# Patient Record
Sex: Female | Born: 1960
Health system: Southern US, Community
[De-identification: ages and names within clinical notes are randomized; demographics above are authoritative.]

## PROBLEM LIST (undated history)

## (undated) DIAGNOSIS — K219 Gastro-esophageal reflux disease without esophagitis: Secondary | ICD-10-CM

## (undated) DIAGNOSIS — Z87442 Personal history of urinary calculi: Secondary | ICD-10-CM

## (undated) DIAGNOSIS — N189 Chronic kidney disease, unspecified: Secondary | ICD-10-CM

## (undated) HISTORY — PX: COLONOSCOPY: SHX174

## (undated) HISTORY — PX: DILATION AND CURETTAGE OF UTERUS: SHX78

## (undated) HISTORY — PX: WISDOM TOOTH EXTRACTION: SHX21

## (undated) HISTORY — PX: OTHER SURGICAL HISTORY: SHX169

## (undated) HISTORY — PX: SPINE SURGERY: SHX786

---

## 1991-11-16 HISTORY — PX: TUBAL LIGATION: SHX77

## 2001-11-22 ENCOUNTER — Other Ambulatory Visit: Admission: RE | Admit: 2001-11-22 | Discharge: 2001-11-22 | Payer: Self-pay | Admitting: Obstetrics and Gynecology

## 2003-03-09 ENCOUNTER — Encounter: Payer: Self-pay | Admitting: Emergency Medicine

## 2003-03-09 ENCOUNTER — Emergency Department (HOSPITAL_COMMUNITY): Admission: EM | Admit: 2003-03-09 | Discharge: 2003-03-09 | Payer: Self-pay | Admitting: Emergency Medicine

## 2003-03-21 ENCOUNTER — Other Ambulatory Visit: Admission: RE | Admit: 2003-03-21 | Discharge: 2003-03-21 | Payer: Self-pay | Admitting: Obstetrics and Gynecology

## 2003-04-09 ENCOUNTER — Encounter: Payer: Self-pay | Admitting: Neurosurgery

## 2003-04-09 ENCOUNTER — Encounter: Admission: RE | Admit: 2003-04-09 | Discharge: 2003-04-09 | Payer: Self-pay | Admitting: Neurosurgery

## 2003-04-09 ENCOUNTER — Encounter: Payer: Self-pay | Admitting: Radiology

## 2003-04-22 ENCOUNTER — Encounter: Payer: Self-pay | Admitting: Neurosurgery

## 2003-04-22 ENCOUNTER — Encounter: Admission: RE | Admit: 2003-04-22 | Discharge: 2003-04-22 | Payer: Self-pay | Admitting: Neurosurgery

## 2004-10-12 ENCOUNTER — Other Ambulatory Visit: Admission: RE | Admit: 2004-10-12 | Discharge: 2004-10-12 | Payer: Self-pay | Admitting: Obstetrics and Gynecology

## 2006-10-24 ENCOUNTER — Ambulatory Visit (HOSPITAL_COMMUNITY): Admission: RE | Admit: 2006-10-24 | Discharge: 2006-10-25 | Payer: Self-pay | Admitting: Neurosurgery

## 2009-10-30 ENCOUNTER — Emergency Department (HOSPITAL_COMMUNITY): Admission: EM | Admit: 2009-10-30 | Discharge: 2009-10-30 | Payer: Self-pay | Admitting: Family Medicine

## 2009-11-01 ENCOUNTER — Emergency Department (HOSPITAL_COMMUNITY): Admission: EM | Admit: 2009-11-01 | Discharge: 2009-11-01 | Payer: Self-pay | Admitting: Family Medicine

## 2011-04-02 NOTE — Op Note (Signed)
[name], ENSLOW              ACCOUNT NO.:  [id number]   MEDICAL RECORD NO.:  [id number]          PATIENT TYPE:  AMB   LOCATION:  SDS                          FACILITY:  [facility]   PHYSICIAN:  Coletta Memos, M.D.     DATE OF BIRTH:  [birth year]   DATE OF PROCEDURE:  10/24/2006  DATE OF DISCHARGE:                               OPERATIVE REPORT   PREOPERATIVE DIAGNOSES:  1. Displaced disk left L5-S1.  2. Left S1 radiculopathy.   POSTOPERATIVE DIAGNOSES:  1. Displaced disk left L5-S1.  2. Left S1 radiculopathy.   PROCEDURE:  Left S1 semihemilaminectomy and diskectomy with  microdissection.   COMPLICATIONS:  None.   SURGEON:  Coletta Memos, M.D.   ASSISTANT:  Hilda Lias, M.D.   INDICATIONS:  [name] [name] is a 85 year old with severe pain in the  back and left lower extremity.  I had previously treated her for  herniated disk at L4-5 with epidural steroids.  She returns today for a  disk at L5-S1 which was quite large and had a large free fragment  associated with that.  I therefore recommended and she agreed to undergo  operative decompression.   OPERATIVE NOTE:  [name] was brought to the operating room  intubated and placed under general anesthetic without difficulty.  She  was rolled prone onto a Wilson frame and all pressure points were  properly padded.  Back was prepped and she was draped in sterile  fashion.  I infiltrated 7 mL 0.5% lidocaine with 1:200,000 epinephrine  into the lumbar region.  I opened the skin with a #10 blade and took  this down to the thoracolumbar fascia sharply.  I then created  semicircular flap in the fascia and exposed the lamina of L5-S1.  I took  an intraoperative x-ray and showed I was in the correct interlaminar  space.  I then opened the ligamentum flavum using a #15 blade to cut  through.  I used a Kerrison punch to then removed the ligamentum flavum  and expose the thecal sac and epidural fat.  At that time I was able  retract the thecal sac medially and encountered what was a very large  hump of disk.  I felt that with further caudal exposure I would be able  to the decrease the amount of traction I had to placed on the thecal sac  and S1 nerve roots.  I removed more bone using a Kerrison punch, this  being the semihemilaminectomy of S1.   I then brought the microscope into the operative field and with  microdissection, I cleared some epidural veins cauterized and divided  those sharply to free the fragment.  I then opened the fragment with a  #15 blade and pulled out two very large pieces of disk material.  I then  inspected the nerve root and surrounding tissue.  I did not go into  the disk space but I did coagulate vessels above it.  The nerve root was  obviously decompressed once I had taken out the fragment.  I then  irrigated, closed wound in layered fashion with Vicryl sutures  reapproximating the thoracolumbar fascia, subcutaneous tissue and  subcuticular layer.  Dr. Jeral Fruit assisted with the diskectomy.           ______________________________  Coletta Memos, M.D.     KC/MEDQ  D:  10/24/2006  T:  10/25/2006  Job:  [id number]

## 2011-05-13 ENCOUNTER — Ambulatory Visit (INDEPENDENT_AMBULATORY_CARE_PROVIDER_SITE_OTHER): Payer: 59 | Admitting: Surgery

## 2011-05-13 ENCOUNTER — Encounter (INDEPENDENT_AMBULATORY_CARE_PROVIDER_SITE_OTHER): Payer: Self-pay | Admitting: Surgery

## 2011-05-13 VITALS — BP 128/86 | HR 70 | Temp 97.2°F | Ht 64.0 in | Wt 153.0 lb

## 2011-05-13 DIAGNOSIS — L720 Epidermal cyst: Secondary | ICD-10-CM | POA: Insufficient documentation

## 2011-05-13 DIAGNOSIS — L02414 Cutaneous abscess of left upper limb: Secondary | ICD-10-CM

## 2011-05-13 DIAGNOSIS — L723 Sebaceous cyst: Secondary | ICD-10-CM

## 2011-05-13 DIAGNOSIS — IMO0002 Reserved for concepts with insufficient information to code with codable children: Secondary | ICD-10-CM

## 2011-05-13 NOTE — Patient Instructions (Signed)
Heat / Massage lumps.  They should resolve in 2 months.  Call if worse or persisting.  OK to stop antibiotics

## 2011-05-13 NOTE — Progress Notes (Signed)
Subjective:     Patient ID: [name] [name], female   DOB: [birth year], 50 y.o.   MRN: [id number]    BP 128/86  Pulse 70  Temp 97.2 F (36.2 C)  Ht 5\' 4"  (1.626 m)  Wt 153 lb (69.4 kg)  BMI 26.26 kg/m2    HPI  Reason for visit is abscess left normal arm consideration of surgery.  Patient is a 50 year old female with a history of a cyst on her body. She had one on her right wrist that spontaneous drained & healed. She had one up near her tailbone that drained and healed. She had one on her right thigh that was removed in surgery.  Patient notes last week she had a lump between her forearm and the arm. She squeezed out some fluid out. He became enlarged and inflamed. She followed up with her primary doctor Dr. Cyndia Bent. She was started on Augmentin antibiotic.  She has improved. She denies any fevers or chills. It now does not drain. She is not sore anymore.  Review of Systems  Constitutional: Negative for fever, chills, diaphoresis and unexpected weight change.  Respiratory: Negative for chest tightness and shortness of breath.   Musculoskeletal: Negative for back pain and arthralgias.  Skin: Negative for color change.       Lump on left forearm - improving  Neurological: Negative for dizziness, numbness and headaches.  Hematological: Negative for adenopathy. Does not bruise/bleed easily.  All other systems reviewed and are negative.       Objective:   Physical Exam  Constitutional: She is oriented to person, place, and time. She appears well-developed and well-nourished. No distress.  HENT:  Head: Normocephalic.  Mouth/Throat: Oropharynx is clear and moist.  Eyes: Conjunctivae and EOM are normal. Pupils are equal, round, and reactive to light. Left eye exhibits no discharge.  Neck: Normal range of motion. Neck supple. No tracheal deviation present. No thyromegaly present.  Cardiovascular: Normal rate, regular rhythm and normal heart sounds.   Pulmonary/Chest: Effort normal  and breath sounds normal. No respiratory distress. She has no wheezes. She has no rales.  Abdominal: Soft. Bowel sounds are normal. She exhibits no distension. There is no tenderness. There is no rebound and no guarding.  Musculoskeletal: Normal range of motion. She exhibits edema. She exhibits no tenderness.       Left forearm / arm mass across fossa 1x1x6cm long.  Nontender, not hot, good range of motion at the elbow  Lymphadenopathy:    She has no cervical adenopathy.  Neurological: She is alert and oriented to person, place, and time. No cranial nerve deficit.  Skin: Skin is warm and dry. No rash noted. She is not diaphoretic. No erythema. No pallor.  Psychiatric: She has a normal mood and affect.       Assessment:     Mass going from left lateral forearm to arm, healing. Most likely old cyst versus thrombophlebitis    Plan:     At this point it seems to be improving on its own. She's completed her Augmentin antibiotics. She had diarrhea with an does not wish to start.  I would like to see how what happens to this naturally. He if it infection or inflammation such as thrombophlebitis, that should actually soft and heal up over the next few months. If it gets reinfected or does not go away, then she may need surgery to remove it. It would best to do this when it's not infected so that we could be  closed.  She feels comfortable with this plan.

## 2013-01-18 ENCOUNTER — Encounter (HOSPITAL_COMMUNITY): Payer: Self-pay | Admitting: *Deleted

## 2013-01-18 ENCOUNTER — Other Ambulatory Visit: Payer: Self-pay | Admitting: Urology

## 2013-01-18 NOTE — Pre-Procedure Instructions (Signed)
Asked to bring blue folder the day of the procedure,insurance card,I.D. driver's license,wear comfortable clothing and have a driver for the day. Asked not to take Advil,Motrin,Ibuprofen,Aleve or any NSAIDS, Aspirin, or Toradol for 72 hours prior to procedure,  No vitamins or herbal medications 7 days prior to procedure. Instructed to take laxative per doctor's office instructions and eat a light dinner the evening before procedure.   To arrive at 0645 for lithotripsy procedure.  

## 2013-01-19 ENCOUNTER — Encounter (HOSPITAL_COMMUNITY): Payer: Self-pay | Admitting: Pharmacy Technician

## 2013-01-24 NOTE — H&P (Signed)
  H&P  Chief Complaint: Left sided kidney stone  History of Present Illness: [name] [name] is a 52 y.o. year old female who recently presented with left hydronephrosis seen on an MRI performed for back pain. CT stone protocol was erformed which revealed an 8x10 mm proximal left ureteral stone. SSD was 12 cm, stone was 840 HU in density. She has significant pain, and presents now for ESL.  Past Medical History  Diagnosis Date  . Chronic kidney disease     kidney stone    Past Surgical History  Procedure Laterality Date  . Tubal ligation  1993  . Dilation and curettage of uterus    . Spine surgery      disc fragment removed    Home Medications:  No prescriptions prior to admission    Allergies:  Allergies  Allergen Reactions  . Sulfa Antibiotics Rash    Family History  Problem Relation Age of Onset  . Hypertension Mother     Social History:  reports that she has been smoking.  She has never used smokeless tobacco. She reports that  drinks alcohol. She reports that she does not use illicit drugs.  ROS: Genitourinary, constitutional, skin, eye, otolaryngeal, hematologic/lymphatic, cardiovascular, pulmonary, endocrine, musculoskeletal, gastrointestinal, neurological and psychiatric system(s) were reviewed and pertinent findings if present are noted.  Genitourinary: nocturia and urinary stream starts and stops.  Gastrointestinal: heartburn.  Musculoskeletal: back pain.      Physical Exam:  Vital signs in last 24 hours:   Constitutional: Well nourished and well developed . No acute distress.  ENT:. The ears and nose are normal in appearance.  Neck: The appearance of the neck is normal and no neck mass is present.  Pulmonary: No respiratory distress and normal respiratory rhythm and effort.  Cardiovascular: Heart rate and rhythm are normal . No peripheral edema.  Abdomen: The abdomen is rounded. The abdomen is soft and nontender. No masses are palpated. No CVA  tenderness. No hernias are palpable. No hepatosplenomegaly noted.  Lymphatics: The femoral and inguinal nodes are not enlarged or tender.  Skin: Normal skin turgor, no visible rash and no visible skin lesions.  Neuro/Psych:. Mood and affect are appropriate.   Laboratory Data:  No results found for this or any previous visit (from the past 24 hour(s)). No results found for this or any previous visit (from the past 240 hour(s)). Creatinine: No results found for this basename: CREATININE,  in the last 168 hours  Radiologic Imaging: No results found.  Impression/Assessment:  8x10 mm left proximal ureteral ston  Plan:  Left ESL  [name] [name] 01/24/2013, 8:41 PM  [name] Millard. Dahlstedt MD

## 2013-01-25 ENCOUNTER — Ambulatory Visit (HOSPITAL_COMMUNITY)
Admission: RE | Admit: 2013-01-25 | Discharge: 2013-01-25 | Disposition: A | Discharge status: Home / Self Care | Payer: 59 | Source: Ambulatory Visit | Attending: Urology | Admitting: Urology

## 2013-01-25 ENCOUNTER — Encounter (HOSPITAL_COMMUNITY): Payer: Self-pay | Admitting: *Deleted

## 2013-01-25 ENCOUNTER — Encounter (HOSPITAL_COMMUNITY)
Admission: RE | Disposition: A | Discharge status: Home / Self Care | Payer: Self-pay | Source: Ambulatory Visit | Attending: Urology

## 2013-01-25 ENCOUNTER — Ambulatory Visit (HOSPITAL_COMMUNITY): Payer: 59

## 2013-01-25 DIAGNOSIS — N201 Calculus of ureter: Secondary | ICD-10-CM | POA: Insufficient documentation

## 2013-01-25 DIAGNOSIS — N1330 Unspecified hydronephrosis: Secondary | ICD-10-CM | POA: Insufficient documentation

## 2013-01-25 HISTORY — DX: Chronic kidney disease, unspecified: N18.9

## 2013-01-25 SURGERY — LITHOTRIPSY, ESWL
Anesthesia: LOCAL | Laterality: Left

## 2013-01-25 MED ORDER — DIAZEPAM 5 MG PO TABS
10.0000 mg | ORAL_TABLET | ORAL | Status: AC
  Administered 2013-01-25: 10 mg via ORAL
  Filled 2013-01-25: qty 2

## 2013-01-25 MED ORDER — DIPHENHYDRAMINE HCL 25 MG PO CAPS
25.0000 mg | ORAL_CAPSULE | ORAL | Status: AC
  Administered 2013-01-25: 25 mg via ORAL
  Filled 2013-01-25: qty 1

## 2013-01-25 MED ORDER — DEXTROSE-NACL 5-0.45 % IV SOLN
INTRAVENOUS | Status: DC
  Administered 2013-01-25: 100 mL via INTRAVENOUS

## 2013-01-25 MED ORDER — LEVOFLOXACIN 500 MG PO TABS
500.0000 mg | ORAL_TABLET | ORAL | Status: AC
  Administered 2013-01-25: 500 mg via ORAL
  Filled 2013-01-25 (×2): qty 1

## 2013-11-02 DIAGNOSIS — N200 Calculus of kidney: Secondary | ICD-10-CM | POA: Insufficient documentation

## 2014-08-05 LAB — HM COLONOSCOPY

## 2014-09-09 NOTE — H&P (Signed)
Cindee SaltLauren B Every  DICTATION # [id number] CSN# [id number]   Meriel PicaHOLLAND,Samia Kukla M, MD 09/09/2014 2:01 PM

## 2014-09-10 NOTE — H&P (Signed)
NAMChristia [name]:  [name], [name]              ACCOUNT NO.:  [id number]  MEDICAL RECORD NO.:  [id number]  LOCATION:                                 FACILITY:  PHYSICIAN:  Duke Salviaichard M. Marcelle OverlieHolland, M.D.    DATE OF BIRTH:  DATE OF ADMISSION:  09/19/2014 DATE OF DISCHARGE:                             HISTORY & PHYSICAL   HISTORY OF PRESENT ILLNESS:  A 53 year old, G2, P2, postmenopausal patient who has had a prior tubal, who has had pelvic prolapse symptoms for several years, but the pressure and discomfort have worsened recently.  I had not seen her in several years until July 2015, when she came back presenting with similar complaints.  Because of some mild SUI symptoms, she had urodynamics that showed normal bladder function with very little leakage noted with a full bladder on straining.  By exam, she does have a moderate uterine prolapse along with cystocele and rectocele.  After discussion of her options, she prefers to have LAVH, A and P repair with bilateral salpingectomy and sacrospinous ligament fixation to support the cuff.  We decided not to pursue midurethral sling at this time because of minimal symptoms.  This procedure including specific risks related to bleeding, infection, transfusion, adjacent organ injury reviewed.  The need for some vaginal estrogen support postop to maintain tissue strength was discussed also.  Other specific risks, the possible need for complete the surgery abdominally along with her expected recovery time reviewed, which she understands and accepts.  PAST MEDICAL HISTORY:  ALLERGIES:  SULFA.  CURRENT MEDICATIONS:  None.  PRIOR SURGERY:  Lithotripsy, minor back surgery, tubal ligation, and two vaginal deliveries.  REVIEW OF SYSTEMS:  Unremarkable.  FAMILY HISTORY:  Significant for hypertension, otherwise negative.  SOCIAL HISTORY:  She is married.  Denies alcohol, tobacco, or drug use.  PHYSICAL EXAMINATION:  VITAL SIGNS:  Temperature 98.2, blood  pressure 120/78. HEENT:  Unremarkable. NECK:  Supple without masses. LUNGS:  Clear. CARDIOVASCULAR:  Regular rate and rhythm without murmurs, rubs, or gallops heard. BREASTS:  Without masses. ABDOMEN:  Soft, flat, nontender.  Vulva, vagina, cervix normal. Moderate uterine descensus with straining along with moderate cystocele and rectocele noted bimanual, otherwise negative.  IMPRESSION:  Symptomatic uterine prolapse with cystocele and rectocele.  PLAN:  LAVH, bilateral salpingectomy with A and P repair, sacrospinous ligament suspension.  Procedure and risks discussed as above.     [name] [name] M. Marcelle OverlieHolland, M.D.     [facility]/MEDQ  D:  09/09/2014  T:  09/10/2014  Job:  [id number]

## 2014-09-13 NOTE — Patient Instructions (Addendum)
   Your procedure is scheduled on:  Thursday, Nov 5  [facility]:  6 AM Pick up the phone at the desk and dial [phone] and inform us of your arrival.  Please call this number if you have any problems the morning of surgery: [phone]  Remember: Do not eat or drink after midnight: Wednesday Take these medicines the morning of surgery with a SIP OF WATER: Nexium  Do not wear jewelry, make-up, or FINGER nail polish No metal in your hair or on your body. Do not wear lotions, powders, perfumes.  You may wear deodorant.  Do not bring valuables to the hospital. Contacts, dentures or bridgework may not be worn into surgery.  Leave suitcase in the car. After Surgery it may be brought to your room. For patients being admitted to the hospital, checkout time is 11:00am the day of discharge.  Home with husband Dorinda HillDonald cell [phone].

## 2014-09-16 ENCOUNTER — Encounter (HOSPITAL_COMMUNITY)
Admission: RE | Admit: 2014-09-16 | Discharge: 2014-09-16 | Disposition: A | Discharge status: Home / Self Care | Payer: 59 | Source: Ambulatory Visit | Attending: Obstetrics and Gynecology | Admitting: Obstetrics and Gynecology

## 2014-09-16 ENCOUNTER — Encounter (HOSPITAL_COMMUNITY): Payer: Self-pay

## 2014-09-16 DIAGNOSIS — Z87891 Personal history of nicotine dependence: Secondary | ICD-10-CM | POA: Diagnosis not present

## 2014-09-16 DIAGNOSIS — N814 Uterovaginal prolapse, unspecified: Secondary | ICD-10-CM | POA: Diagnosis present

## 2014-09-16 HISTORY — DX: Personal history of urinary calculi: Z87.442

## 2014-09-16 HISTORY — DX: Gastro-esophageal reflux disease without esophagitis: K21.9

## 2014-09-16 LAB — CBC
HCT: 42.9 % (ref 36.0–46.0)
HEMOGLOBIN: 14.2 g/dL (ref 12.0–15.0)
MCH: 31.9 pg (ref 26.0–34.0)
MCHC: 33.1 g/dL (ref 30.0–36.0)
MCV: 96.4 fL (ref 78.0–100.0)
PLATELETS: 257 10*3/uL (ref 150–400)
RBC: 4.45 MIL/uL (ref 3.87–5.11)
RDW: 12 % (ref 11.5–15.5)
WBC: 7.8 10*3/uL (ref 4.0–10.5)

## 2014-09-18 MED ORDER — DEXTROSE 5 % IV SOLN
2.0000 g | INTRAVENOUS | Status: AC
  Administered 2014-09-19: 2 g via INTRAVENOUS
  Filled 2014-09-18: qty 2

## 2014-09-19 ENCOUNTER — Ambulatory Visit (HOSPITAL_COMMUNITY): Payer: 59 | Admitting: Certified Registered Nurse Anesthetist

## 2014-09-19 ENCOUNTER — Encounter (HOSPITAL_COMMUNITY): Payer: Self-pay | Admitting: Anesthesiology

## 2014-09-19 ENCOUNTER — Encounter (HOSPITAL_COMMUNITY)
Admission: RE | Disposition: A | Discharge status: Home / Self Care | Payer: Self-pay | Source: Ambulatory Visit | Attending: Obstetrics and Gynecology

## 2014-09-19 ENCOUNTER — Observation Stay (HOSPITAL_COMMUNITY)
Admission: RE | Admit: 2014-09-19 | Discharge: 2014-09-20 | Disposition: A | Discharge status: Home / Self Care | Payer: 59 | Source: Ambulatory Visit | Attending: Obstetrics and Gynecology | Admitting: Obstetrics and Gynecology

## 2014-09-19 DIAGNOSIS — N814 Uterovaginal prolapse, unspecified: Secondary | ICD-10-CM | POA: Diagnosis not present

## 2014-09-19 DIAGNOSIS — Z87891 Personal history of nicotine dependence: Secondary | ICD-10-CM | POA: Insufficient documentation

## 2014-09-19 HISTORY — PX: LAPAROSCOPY: SHX197

## 2014-09-19 HISTORY — PX: ANTERIOR AND POSTERIOR REPAIR WITH SACROSPINOUS FIXATION: SHX6536

## 2014-09-19 HISTORY — PX: VAGINAL HYSTERECTOMY: SHX2639

## 2014-09-19 SURGERY — LAPAROSCOPY, DIAGNOSTIC
Anesthesia: General | Site: Vagina

## 2014-09-19 MED ORDER — ONDANSETRON HCL 4 MG/2ML IJ SOLN: 4.0000 mg | Freq: Four times a day (QID) | INTRAMUSCULAR | Status: DC | PRN

## 2014-09-19 MED ORDER — IBUPROFEN 800 MG PO TABS: 800.0000 mg | ORAL_TABLET | Freq: Three times a day (TID) | ORAL | Status: DC | PRN

## 2014-09-19 MED ORDER — DEXAMETHASONE SODIUM PHOSPHATE 10 MG/ML IJ SOLN
INTRAMUSCULAR | Status: DC | PRN
  Administered 2014-09-19: 4 mg via INTRAVENOUS

## 2014-09-19 MED ORDER — MEPERIDINE HCL 25 MG/ML IJ SOLN
6.2500 mg | INTRAMUSCULAR | Status: DC | PRN
  Administered 2014-09-19: 6.25 mg via INTRAVENOUS

## 2014-09-19 MED ORDER — ESTROGENS, CONJUGATED 0.625 MG/GM VA CREA
TOPICAL_CREAM | VAGINAL | Status: AC
  Filled 2014-09-19: qty 42.5

## 2014-09-19 MED ORDER — ATROPINE SULFATE 0.4 MG/ML IJ SOLN
INTRAMUSCULAR | Status: DC | PRN
  Administered 2014-09-19: 0.4 mg via INTRAVENOUS

## 2014-09-19 MED ORDER — NEOSTIGMINE METHYLSULFATE 10 MG/10ML IV SOLN
INTRAVENOUS | Status: DC | PRN
Start: 2014-09-19 — End: 2014-09-19
  Administered 2014-09-19: 2 mg via INTRAVENOUS

## 2014-09-19 MED ORDER — SODIUM CHLORIDE 0.9 % IJ SOLN
INTRAMUSCULAR | Status: AC
  Filled 2014-09-19: qty 10

## 2014-09-19 MED ORDER — KETOROLAC TROMETHAMINE 30 MG/ML IJ SOLN: 30.0000 mg | Freq: Four times a day (QID) | INTRAMUSCULAR | Status: DC

## 2014-09-19 MED ORDER — SODIUM CHLORIDE 0.9 % IJ SOLN: 9.0000 mL | INTRAMUSCULAR | Status: DC | PRN

## 2014-09-19 MED ORDER — LIDOCAINE HCL (CARDIAC) 20 MG/ML IV SOLN
INTRAVENOUS | Status: DC | PRN
  Administered 2014-09-19: 30 mg via INTRAVENOUS

## 2014-09-19 MED ORDER — ONDANSETRON HCL 4 MG/2ML IJ SOLN
INTRAMUSCULAR | Status: AC
  Filled 2014-09-19: qty 2

## 2014-09-19 MED ORDER — MORPHINE SULFATE (PF) 1 MG/ML IV SOLN
INTRAVENOUS | Status: DC
  Administered 2014-09-19: 6 mg via INTRAVENOUS
  Administered 2014-09-19: 12:00:00 via INTRAVENOUS
  Administered 2014-09-19: 9 mL via INTRAVENOUS
  Filled 2014-09-19: qty 25

## 2014-09-19 MED ORDER — SODIUM CHLORIDE 0.9 % IJ SOLN
INTRAMUSCULAR | Status: DC | PRN
  Administered 2014-09-19: 10 mL

## 2014-09-19 MED ORDER — DEXTROSE IN LACTATED RINGERS 5 % IV SOLN
INTRAVENOUS | Status: DC
  Administered 2014-09-19 – 2014-09-20 (×3): via INTRAVENOUS

## 2014-09-19 MED ORDER — FENTANYL CITRATE 0.05 MG/ML IJ SOLN
INTRAMUSCULAR | Status: AC
  Filled 2014-09-19: qty 5

## 2014-09-19 MED ORDER — HYDROMORPHONE HCL 1 MG/ML IJ SOLN
INTRAMUSCULAR | Status: AC
  Administered 2014-09-19: 0.5 mg via INTRAVENOUS
  Filled 2014-09-19: qty 1

## 2014-09-19 MED ORDER — MENTHOL 3 MG MT LOZG: 1.0000 | LOZENGE | OROMUCOSAL | Status: DC | PRN

## 2014-09-19 MED ORDER — NEOSTIGMINE METHYLSULFATE 10 MG/10ML IV SOLN
INTRAVENOUS | Status: AC
  Filled 2014-09-19: qty 1

## 2014-09-19 MED ORDER — ESTROGENS, CONJUGATED 0.625 MG/GM VA CREA
TOPICAL_CREAM | VAGINAL | Status: DC | PRN
  Administered 2014-09-19: 1 via VAGINAL

## 2014-09-19 MED ORDER — KETOROLAC TROMETHAMINE 30 MG/ML IJ SOLN
INTRAMUSCULAR | Status: DC | PRN
  Administered 2014-09-19: 30 mg via INTRAVENOUS

## 2014-09-19 MED ORDER — PROPOFOL 10 MG/ML IV EMUL
INTRAVENOUS | Status: AC
  Filled 2014-09-19: qty 20

## 2014-09-19 MED ORDER — LIDOCAINE-EPINEPHRINE 1 %-1:100000 IJ SOLN
INTRAMUSCULAR | Status: DC | PRN
  Administered 2014-09-19: 4 mL

## 2014-09-19 MED ORDER — OXYCODONE-ACETAMINOPHEN 5-325 MG PO TABS
1.0000 | ORAL_TABLET | ORAL | Status: DC | PRN
  Administered 2014-09-20: 1 via ORAL
  Filled 2014-09-19: qty 1

## 2014-09-19 MED ORDER — KETOROLAC TROMETHAMINE 30 MG/ML IJ SOLN
30.0000 mg | Freq: Four times a day (QID) | INTRAMUSCULAR | Status: DC
  Administered 2014-09-19 – 2014-09-20 (×3): 30 mg via INTRAVENOUS
  Filled 2014-09-19 (×3): qty 1

## 2014-09-19 MED ORDER — FENTANYL CITRATE 0.05 MG/ML IJ SOLN
INTRAMUSCULAR | Status: DC | PRN
  Administered 2014-09-19 (×5): 50 ug via INTRAVENOUS

## 2014-09-19 MED ORDER — KETOROLAC TROMETHAMINE 30 MG/ML IJ SOLN: 15.0000 mg | Freq: Once | INTRAMUSCULAR | Status: DC | PRN

## 2014-09-19 MED ORDER — HYDROMORPHONE HCL 1 MG/ML IJ SOLN
0.2500 mg | INTRAMUSCULAR | Status: DC | PRN
  Administered 2014-09-19 (×2): 0.5 mg via INTRAVENOUS

## 2014-09-19 MED ORDER — BUPIVACAINE HCL (PF) 0.25 % IJ SOLN
INTRAMUSCULAR | Status: AC
  Filled 2014-09-19: qty 30

## 2014-09-19 MED ORDER — MEPERIDINE HCL 25 MG/ML IJ SOLN
INTRAMUSCULAR | Status: AC
  Filled 2014-09-19: qty 1

## 2014-09-19 MED ORDER — DIPHENHYDRAMINE HCL 50 MG/ML IJ SOLN: 12.5000 mg | Freq: Four times a day (QID) | INTRAMUSCULAR | Status: DC | PRN

## 2014-09-19 MED ORDER — PROPOFOL 10 MG/ML IV BOLUS
INTRAVENOUS | Status: DC | PRN
  Administered 2014-09-19: 180 mg via INTRAVENOUS

## 2014-09-19 MED ORDER — LIDOCAINE HCL (PF) 1 % IJ SOLN
INTRAMUSCULAR | Status: AC
  Filled 2014-09-19: qty 5

## 2014-09-19 MED ORDER — SCOPOLAMINE 1 MG/3DAYS TD PT72
MEDICATED_PATCH | TRANSDERMAL | Status: AC
  Administered 2014-09-19: 1.5 mg via TRANSDERMAL
  Filled 2014-09-19: qty 1

## 2014-09-19 MED ORDER — BUTORPHANOL TARTRATE 1 MG/ML IJ SOLN: 1.0000 mg | INTRAMUSCULAR | Status: DC | PRN

## 2014-09-19 MED ORDER — NALOXONE HCL 0.4 MG/ML IJ SOLN: 0.4000 mg | INTRAMUSCULAR | Status: DC | PRN

## 2014-09-19 MED ORDER — ONDANSETRON HCL 4 MG/2ML IJ SOLN
INTRAMUSCULAR | Status: DC | PRN
  Administered 2014-09-19: 4 mg via INTRAVENOUS

## 2014-09-19 MED ORDER — DIPHENHYDRAMINE HCL 12.5 MG/5ML PO ELIX: 12.5000 mg | ORAL_SOLUTION | Freq: Four times a day (QID) | ORAL | Status: DC | PRN

## 2014-09-19 MED ORDER — ONDANSETRON HCL 4 MG PO TABS: 4.0000 mg | ORAL_TABLET | Freq: Four times a day (QID) | ORAL | Status: DC | PRN

## 2014-09-19 MED ORDER — MIDAZOLAM HCL 2 MG/2ML IJ SOLN
INTRAMUSCULAR | Status: AC
  Filled 2014-09-19: qty 2

## 2014-09-19 MED ORDER — ROCURONIUM BROMIDE 100 MG/10ML IV SOLN
INTRAVENOUS | Status: DC | PRN
  Administered 2014-09-19: 40 mg via INTRAVENOUS

## 2014-09-19 MED ORDER — PROMETHAZINE HCL 25 MG/ML IJ SOLN: 6.2500 mg | INTRAMUSCULAR | Status: DC | PRN

## 2014-09-19 MED ORDER — ESTRADIOL 0.1 MG/GM VA CREA
TOPICAL_CREAM | VAGINAL | Status: AC
  Filled 2014-09-19: qty 42.5

## 2014-09-19 MED ORDER — GLYCOPYRROLATE 0.2 MG/ML IJ SOLN
INTRAMUSCULAR | Status: AC
Start: 2014-09-19 — End: 2014-09-19
  Filled 2014-09-19: qty 3

## 2014-09-19 MED ORDER — BUPIVACAINE HCL (PF) 0.25 % IJ SOLN
INTRAMUSCULAR | Status: DC | PRN
  Administered 2014-09-19: 8 mL

## 2014-09-19 MED ORDER — SCOPOLAMINE 1 MG/3DAYS TD PT72
1.0000 | MEDICATED_PATCH | Freq: Once | TRANSDERMAL | Status: DC
  Administered 2014-09-19: 1.5 mg via TRANSDERMAL

## 2014-09-19 MED ORDER — MIDAZOLAM HCL 2 MG/2ML IJ SOLN
INTRAMUSCULAR | Status: DC | PRN
  Administered 2014-09-19: 2 mg via INTRAVENOUS

## 2014-09-19 MED ORDER — LACTATED RINGERS IV SOLN
INTRAVENOUS | Status: DC
  Administered 2014-09-19 (×2): via INTRAVENOUS

## 2014-09-19 MED ORDER — SODIUM CHLORIDE 0.9 % IJ SOLN
INTRAMUSCULAR | Status: AC
  Filled 2014-09-19: qty 50

## 2014-09-19 MED ORDER — LIDOCAINE-EPINEPHRINE 1 %-1:100000 IJ SOLN
INTRAMUSCULAR | Status: AC
  Filled 2014-09-19: qty 1

## 2014-09-19 MED ORDER — DEXAMETHASONE SODIUM PHOSPHATE 4 MG/ML IJ SOLN
INTRAMUSCULAR | Status: AC
  Filled 2014-09-19: qty 1

## 2014-09-19 MED ORDER — KETOROLAC TROMETHAMINE 30 MG/ML IJ SOLN: 30.0000 mg | Freq: Once | INTRAMUSCULAR | Status: DC

## 2014-09-19 MED ORDER — GLYCOPYRROLATE 0.2 MG/ML IJ SOLN
INTRAMUSCULAR | Status: DC | PRN
  Administered 2014-09-19: 0.2 mg via INTRAVENOUS

## 2014-09-19 SURGICAL SUPPLY — 62 items
BLADE SURG 10 STRL SS (BLADE) ×6 IMPLANT
BLADE SURG 11 STRL SS (BLADE) ×12 IMPLANT
CABLE HIGH FREQUENCY MONO STRZ (ELECTRODE) IMPLANT
CANISTER SUCT 3000ML (MISCELLANEOUS) ×6 IMPLANT
CATH ROBINSON RED A/P 16FR (CATHETERS) IMPLANT
CLOTH BEACON ORANGE TIMEOUT ST (SAFETY) ×6 IMPLANT
CONT PATH 16OZ SNAP LID 3702 (MISCELLANEOUS) ×6 IMPLANT
COVER BACK TABLE 60X90IN (DRAPES) ×6 IMPLANT
DECANTER SPIKE VIAL GLASS SM (MISCELLANEOUS) ×18 IMPLANT
DEVICE CAPIO SLIM SINGLE (INSTRUMENTS) IMPLANT
DRSG COVADERM PLUS 2X2 (GAUZE/BANDAGES/DRESSINGS) IMPLANT
DRSG OPSITE POSTOP 3X4 (GAUZE/BANDAGES/DRESSINGS) ×6 IMPLANT
DRSG OPSITE POSTOP 4X10 (GAUZE/BANDAGES/DRESSINGS) ×6 IMPLANT
DURAPREP 26ML APPLICATOR (WOUND CARE) ×6 IMPLANT
ELECT LIGASURE SHORT 9 REUSE (ELECTRODE) ×6 IMPLANT
ELECT REM PT RETURN 9FT ADLT (ELECTROSURGICAL) ×6
ELECTRODE REM PT RTRN 9FT ADLT (ELECTROSURGICAL) ×4 IMPLANT
GAUZE PACKING 1 X5 YD ST (GAUZE/BANDAGES/DRESSINGS) ×6 IMPLANT
GLOVE BIO SURGEON STRL SZ7 (GLOVE) ×18 IMPLANT
GLOVE BIOGEL PI IND STRL 6.5 (GLOVE) ×4 IMPLANT
GLOVE BIOGEL PI IND STRL 7.0 (GLOVE) ×12 IMPLANT
GLOVE BIOGEL PI INDICATOR 6.5 (GLOVE) ×2
GLOVE BIOGEL PI INDICATOR 7.0 (GLOVE) ×6
GOWN STRL REUS W/ TWL LRG LVL3 (GOWN DISPOSABLE) ×28 IMPLANT
GOWN STRL REUS W/TWL LRG LVL3 (GOWN DISPOSABLE) ×14 IMPLANT
LIQUID BAND (GAUZE/BANDAGES/DRESSINGS) ×6 IMPLANT
NEEDLE ANCHOR KEITH 2 7/8 STR (NEEDLE) ×6 IMPLANT
NEEDLE HYPO 22GX1.5 SAFETY (NEEDLE) ×6 IMPLANT
NEEDLE INSUFFLATION 120MM (ENDOMECHANICALS) ×6 IMPLANT
NEEDLE SPNL 22GX3.5 QUINCKE BK (NEEDLE) ×6 IMPLANT
NS IRRIG 1000ML POUR BTL (IV SOLUTION) ×6 IMPLANT
PACK LAVH (CUSTOM PROCEDURE TRAY) ×6 IMPLANT
PACK VAGINAL WOMENS (CUSTOM PROCEDURE TRAY) IMPLANT
PAD TRENDELENBURG OR TABLE (MISCELLANEOUS) ×6 IMPLANT
PROTECTOR NERVE ULNAR (MISCELLANEOUS) IMPLANT
SEALER TISSUE G2 CVD JAW 45CM (ENDOMECHANICALS) IMPLANT
SET CYSTO W/LG BORE CLAMP LF (SET/KITS/TRAYS/PACK) IMPLANT
SET IRRIG TUBING LAPAROSCOPIC (IRRIGATION / IRRIGATOR) ×6 IMPLANT
SUT CAPIO POLYGLYCOLIC (SUTURE) IMPLANT
SUT CHROMIC 2 0 CT 1 (SUTURE) IMPLANT
SUT MON AB 2-0 CT1 27 (SUTURE) ×18 IMPLANT
SUT MON AB 2-0 CT1 36 (SUTURE) ×6 IMPLANT
SUT SILK 2 0 FSL 18 (SUTURE) IMPLANT
SUT VIC AB 0 CT1 18XCR BRD8 (SUTURE) ×8 IMPLANT
SUT VIC AB 0 CT1 36 (SUTURE) ×6 IMPLANT
SUT VIC AB 0 CT1 8-18 (SUTURE) ×4
SUT VIC AB 2-0 CT1 27 (SUTURE)
SUT VIC AB 2-0 CT1 TAPERPNT 27 (SUTURE) IMPLANT
SUT VIC AB 2-0 SH 27 (SUTURE) ×2
SUT VIC AB 2-0 SH 27XBRD (SUTURE) ×4 IMPLANT
SUT VIC AB 2-0 UR6 27 (SUTURE) ×12 IMPLANT
SUT VICRYL 0 TIES 12 18 (SUTURE) ×6 IMPLANT
SUT VICRYL 4-0 PS2 18IN ABS (SUTURE) ×6 IMPLANT
SUT VICRYL RAPIDE 3-0 36IN (SUTURE) ×6 IMPLANT
SYR CONTROL 10ML LL (SYRINGE) ×6 IMPLANT
TOWEL OR 17X24 6PK STRL BLUE (TOWEL DISPOSABLE) ×18 IMPLANT
TRAY FOLEY CATH 14FR (SET/KITS/TRAYS/PACK) ×6 IMPLANT
TROCAR DILATING TIP 12MM 150MM (ENDOMECHANICALS) ×6 IMPLANT
TROCAR OPTI TIP 5M 100M (ENDOMECHANICALS) ×6 IMPLANT
TROCAR XCEL DIL TIP R 11M (ENDOMECHANICALS) ×6 IMPLANT
WARMER LAPAROSCOPE (MISCELLANEOUS) ×6 IMPLANT
WATER STERILE IRR 1000ML POUR (IV SOLUTION) IMPLANT

## 2014-09-19 NOTE — Addendum Note (Signed)
Addendum  created 09/19/14 1610 by Graciela HusbandsWynn O Danene Montijo, CRNA   Modules edited: Notes Section   Notes Section:  File: [id number]

## 2014-09-19 NOTE — Anesthesia Procedure Notes (Signed)
Procedure Name: Intubation Date/Time: 09/19/2014 7:26 AM Performed by: Cleda ClarksBROWDER, Jadis Pitter R Pre-anesthesia Checklist: Patient identified, Emergency Drugs available, Suction available, Patient being monitored and Timeout performed Patient Re-evaluated:Patient Re-evaluated prior to inductionOxygen Delivery Method: Circle system utilized Preoxygenation: Pre-oxygenation with 100% oxygen Intubation Type: IV induction Ventilation: Mask ventilation without difficulty Laryngoscope Size: Miller and 2 Grade View: Grade II Tube type: Oral Tube size: 7.0 mm Number of attempts: 1 Airway Equipment and Method: Stylet Secured at: 18 cm Tube secured with: Tape Dental Injury: Teeth and Oropharynx as per pre-operative assessment

## 2014-09-19 NOTE — Transfer of Care (Signed)
Immediate Anesthesia Transfer of Care Note  Patient: [name] [name]  Procedure(s) Performed: Procedure(s): FAILED LAPAROSCOPY DIAGNOSTIC (N/A) HYSTERECTOMY VAGINAL (N/A) ANTERIOR AND POSTERIOR REPAIR WITH SACROSPINOUS FIXATION (N/A)  Patient Location: PACU  Anesthesia Type:General  Level of Consciousness: awake, alert  and oriented  Airway & Oxygen Therapy: Patient Spontanous Breathing and Patient connected to nasal cannula oxygen  Post-op Assessment: Report given to PACU RN and Post -op Vital signs reviewed and stable  Post vital signs: Reviewed and stable  Complications: No apparent anesthesia complications

## 2014-09-19 NOTE — Anesthesia Preprocedure Evaluation (Signed)
Anesthesia Evaluation  Patient identified by MRN, date of birth, ID band Patient awake    Reviewed: Allergy & Precautions, H&P , NPO status , Patient's Chart, lab work & pertinent test results, reviewed documented beta blocker date and time   Airway Mallampati: II  TM Distance: <3 FB Neck ROM: full    Dental no notable dental hx. (+) Teeth Intact   Pulmonary former smoker,    Pulmonary exam normal       Cardiovascular negative cardio ROS      Neuro/Psych negative neurological ROS  negative psych ROS   GI/Hepatic negative GI ROS, Neg liver ROS,   Endo/Other  negative endocrine ROS  Renal/GU negative Renal ROS     Musculoskeletal   Abdominal Normal abdominal exam  (+)   Peds  Hematology negative hematology ROS (+)   Anesthesia Other Findings   Reproductive/Obstetrics negative OB ROS                             Anesthesia Physical Anesthesia Plan  ASA: II  Anesthesia Plan: General   Post-op Pain Management:    Induction: Intravenous  Airway Management Planned: Oral ETT  Additional Equipment:   Intra-op Plan:   Post-operative Plan: Extubation in OR  Informed Consent: I have reviewed the patients History and Physical, chart, labs and discussed the procedure including the risks, benefits and alternatives for the proposed anesthesia with the patient or authorized representative who has indicated his/her understanding and acceptance.   Dental Advisory Given  Plan Discussed with: CRNA, Surgeon and Anesthesiologist  Anesthesia Plan Comments:         Anesthesia Quick Evaluation

## 2014-09-19 NOTE — Progress Notes (Signed)
The patient was re-examined with no change in status 

## 2014-09-19 NOTE — Plan of Care (Signed)
Problem: Phase I Progression Outcomes Goal: Pain controlled with appropriate interventions Outcome: Completed/Met Date Met:  09/19/14

## 2014-09-19 NOTE — Anesthesia Postprocedure Evaluation (Signed)
Anesthesia Post Note  Patient: [name] [name]  Procedure(s) Performed: Procedure(s): FAILED LAPAROSCOPY DIAGNOSTIC (N/A) HYSTERECTOMY VAGINAL (N/A) ANTERIOR AND POSTERIOR REPAIR WITH SACROSPINOUS FIXATION (N/A)  Anesthesia type: General  Patient location: [facility]  Post pain: Pain level controlled  Post assessment: Post-op Vital signs reviewed  Last Vitals: BP 138/79 mmHg  Pulse 66  Temp(Src) 37.1 C (Oral)  Resp 16  Ht 5' 3.5" (1.613 m)  Wt 165 lb (74.844 kg)  BMI 28.77 kg/m2  SpO2 98%  Post vital signs: Reviewed  Level of consciousness: awake  Complications: No apparent anesthesia complications

## 2014-09-19 NOTE — Plan of Care (Signed)
Problem: Phase I Progression Outcomes Goal: Admission history reviewed Outcome: Completed/Met Date Met:  09/19/14

## 2014-09-19 NOTE — Plan of Care (Signed)
Problem: Phase I Progression Outcomes Goal: Dangle/OOB as tolerated per MD order Outcome: Completed/Met Date Met:  09/19/14

## 2014-09-19 NOTE — Anesthesia Postprocedure Evaluation (Signed)
  Anesthesia Post-op Note  Patient: [name] [name]  Procedure(s) Performed: Procedure(s): FAILED LAPAROSCOPY DIAGNOSTIC (N/A) HYSTERECTOMY VAGINAL (N/A) ANTERIOR AND POSTERIOR REPAIR WITH SACROSPINOUS FIXATION (N/A) Patient is awake and responsive. Pain and nausea are reasonably well controlled. Vital signs are stable and clinically acceptable. Oxygen saturation is clinically acceptable. There are no apparent anesthetic complications at this time. Patient is ready for discharge.

## 2014-09-19 NOTE — Op Note (Signed)
Preoperative diagnosis:uterine prolapse, cystocele, rectocele.  Postoperative diagnosis: Same  Procedure: Failed laparoscopy, TVH, anterior and posterior colporrhaphy, sacrospinous ligament suspension  Surgeon: Marcelle OverlieHolland  Asst.: McComb  EBL: 1 25 cc  Procedure and findings:  Patient taken the operating room after an adequate level of general anesthesia was obtained with the legs in stirrups the perineum and vagina prepped and draped separately, the bladder was drained, Hulka tenaculum was positioned. This was all done after appropriate timeouts were taken. The abdomen was prepped separately and draped.  The subumbilical area was infiltrated with quarter percent Marcaine plain small incision was made in the varies needle was introduced without difficulty. Its intra-abdominal position was verified by pressure and water testing. After a 3 L pneumoperitoneum syncopated, several attempts to pass a regular and then a longer trocar were both a pre-peritoneal. Decision made at that point especially in view of the significant prolapsed is to proceed with the procedure vaginally. The small subumbilical umbilical incision was closed with 4-0 Vicryl suture. The legs were extended, weighted speculum was positioned cervix grasped with tenaculum the cervical vaginal mucosa was incised, the anterior Coso was advanced sharp and blunt dissection until the anterior peritoneal reflection could be identified. The peritoneum was then entered sharply and a retractor then used to gently elevate the bladder out of the field. Posteriorly the posterior culdotomy was performed, with the uterus on traction the uterosacral ligament, cardinal ligament, uterine vasculature pedicles and upper broad ligament pedicles were clamped divided and suture ligated with 0 Vicryl. The fundus of the uterus is in delivered posteriorly. Remaining pedicles were clamped first free tie followed by suture ligature of Vicryl. Bilateral tubes and ovaries  were normal that point. However the right tube could not be pulled readily down into the incision and the left tube was kinked precluding easy dissection on that side. Decision made to not proceed with bilateral salpingectomy with normal anatomy. Vaginal cuff was then closed from 3 to 9:00 with a running locked 2-0 Vicryl suture. Prior to closure sponge, needle, instrument counts reported as correct 2. The vaginal mucosa was then closed right to left with 2-0 Monocryl interrupted sutures. The anterior repair was started at this point  Allis clamps then used to grasp the proximal edge, midline Allis was placed at the urethral junction, this was infiltrated with diluted Xylocaine with epinephrine. Midline incision was made in the perivesical fascia was dissected with sharp and blunt dissection. Small amount of excess mucosa was trimmed and 2-0 Vicryl sutures used to plicate the perivesical fascia in the midline. Mucosa closed with 2-0 Monocryl interrupted sutures. Posteriorly, small triangle of perineal skin was excised, the posterior vaginal mucosa was dissected in the midline approximately two thirds the way up. The perirectal fascia was then separated with sharp and blunt dissection. This point the surgeon's finger was then used to palpate the 6 minus ligament on the right first running the initial spine and separating some of the fat over top of the sacrospinous ligament. Your device was then used 2 finger breaths above the ischial spine to place a retention suture. This was held temporarily. Perirectal fascia was then plicated in the midline with 2-0 Vicryl suture. In the proximal vaginal mucosa was closed with interrupted 2-0 Monocryl sutures. This been repaired about halfway down, the sacrospinous suture was anchored at the proximal point of the vagina and tied down with excellent support of the vaginal cuff. Remainder the vaginal mucosa was then closed with interrupted 2-0 Monocryl sutures. 3-0 Vicryl  repeat sutures on the perineal skin 1 inch packing with estrogen was applied Foley catheter positioned draining clear urine. She tolerated this well went to recovery room in good condition.  Dictated with [facility]  [name] [name] [name] ObeyM [name] [name] M.D.

## 2014-09-19 NOTE — Plan of Care (Signed)
Problem: Phase I Progression Outcomes Goal: IS, TCDB as ordered Outcome: Completed/Met Date Met:  09/19/14

## 2014-09-20 DIAGNOSIS — N814 Uterovaginal prolapse, unspecified: Secondary | ICD-10-CM | POA: Diagnosis not present

## 2014-09-20 LAB — CBC
HCT: 37.1 % (ref 36.0–46.0)
HEMOGLOBIN: 12.2 g/dL (ref 12.0–15.0)
MCH: 31.3 pg (ref 26.0–34.0)
MCHC: 32.9 g/dL (ref 30.0–36.0)
MCV: 95.1 fL (ref 78.0–100.0)
PLATELETS: 218 10*3/uL (ref 150–400)
RBC: 3.9 MIL/uL (ref 3.87–5.11)
RDW: 11.9 % (ref 11.5–15.5)
WBC: 11.1 10*3/uL — AB (ref 4.0–10.5)

## 2014-09-20 MED ORDER — IBUPROFEN 800 MG PO TABS: 800.0000 mg | ORAL_TABLET | Freq: Three times a day (TID) | ORAL | Status: DC | PRN

## 2014-09-20 MED ORDER — OXYCODONE-ACETAMINOPHEN 5-325 MG PO TABS: 1.0000 | ORAL_TABLET | ORAL | Status: DC | PRN

## 2014-09-20 NOTE — Discharge Summary (Signed)
Physician Discharge Summary  Patient ID: [name] [name] MRN: [id number] DOB/AGE: 53/06/1961 53 y.o.  Admit date: 09/19/2014 Discharge date: 09/20/2014  Admission Diagnoses:uterine prolapse, cystocele/rectocele Discharge Diagnoses: same Active Problems:   Prolapse of uterus   Discharged Condition: good  Hospital Course: adm for TVH , A and P repair, SSLS, on POD # 1, doing well, afeb, tol PO, voiding  Consults: None  Significant Diagnostic Studies: labs:  CBC    Component Value Date/Time   WBC 11.1* 09/20/2014 0538   RBC 3.90 09/20/2014 0538   HGB 12.2 09/20/2014 0538   HCT 37.1 09/20/2014 0538   PLT 218 09/20/2014 0538   MCV 95.1 09/20/2014 0538   MCH 31.3 09/20/2014 0538   MCHC 32.9 09/20/2014 0538   RDW 11.9 09/20/2014 0538      Treatments: surgery: TVH A and P repair, SSLS  Discharge Exam: Blood pressure 139/72, pulse 64, temperature 98.6 F (37 C), temperature source Oral, resp. rate 16, height 5' 3.5" (1.613 m), weight 165 lb (74.844 kg), SpO2 96 %. GI: soft, non-tender; bowel sounds normal; no masses,  no organomegaly  Disposition: 01-Home or Self Care     Medication List    STOP taking these medications        esomeprazole 20 MG capsule  Commonly known as:  NEXIUM      TAKE these medications        ibuprofen 800 MG tablet  Commonly known as:  ADVIL,MOTRIN  Take 1 tablet (800 mg total) by mouth every 8 (eight) hours as needed for moderate pain (mild pain).     oxyCODONE-acetaminophen 5-325 MG per tablet  Commonly known as:  PERCOCET/ROXICET  Take 1-2 tablets by mouth every 4 (four) hours as needed for severe pain (moderate to severe pain (when tolerating fluids)).           Follow-up Information    Follow up with Meriel PicaHOLLAND,Maleea Camilo M, MD. Schedule an appointment as soon as possible for a visit in 10 days.   Specialty:  Obstetrics and Gynecology   Contact information:   [address] [place] [id number] [phone]        Signed: Meriel PicaHOLLAND,[place] Rico M 09/20/2014, 8:28 AM

## 2014-09-20 NOTE — Progress Notes (Signed)
Vaginal packing removed. Packing intact. Moderate amount of sanguineous drainage. 3 dime sized clots noted. Pt tolerated well. Will continue to monitor.

## 2014-09-20 NOTE — Progress Notes (Signed)
UR chart review completed.  

## 2014-09-20 NOTE — Plan of Care (Signed)
Problem: Consults Goal: GYN POST OP OBSV 2 days Patient Education (See Patient Education module for education specifics.)  Outcome: Completed/Met Date Met:  09/20/14  Problem: Phase I Progression Outcomes Goal: VS, stable, temp < 100.4 degrees F Outcome: Completed/Met Date Met:  09/20/14 Goal: I & O every 4 hrs or as ordered Outcome: Completed/Met Date Met:  09/20/14 Goal: Other Phase I Outcomes/Goals Outcome: Completed/Met Date Met:  09/20/14  Problem: Phase II Progression Outcomes Goal: Progress activity as tolerated unless otherwise ordered Outcome: Completed/Met Date Met:  09/20/14 Goal: Afebrile, VS remain stable Outcome: Completed/Met Date Met:  09/20/14 Goal: Incision/dressings dry and intact Outcome: Completed/Met Date Met:  09/20/14  Problem: Discharge Progression Outcomes Goal: Tolerating diet Outcome: Completed/Met Date Met:  09/20/14

## 2014-09-21 ENCOUNTER — Encounter (HOSPITAL_COMMUNITY): Payer: Self-pay | Admitting: Obstetrics and Gynecology

## 2015-08-26 LAB — HM PAP SMEAR

## 2016-08-11 ENCOUNTER — Other Ambulatory Visit: Payer: Self-pay | Admitting: Urology

## 2016-08-11 ENCOUNTER — Encounter (HOSPITAL_COMMUNITY): Payer: Self-pay | Admitting: General Practice

## 2016-08-12 ENCOUNTER — Encounter (HOSPITAL_COMMUNITY)
Admission: RE | Disposition: A | Discharge status: Home / Self Care | Payer: Self-pay | Source: Ambulatory Visit | Attending: Urology

## 2016-08-12 ENCOUNTER — Ambulatory Visit (HOSPITAL_COMMUNITY)
Admission: RE | Admit: 2016-08-12 | Discharge: 2016-08-12 | Disposition: A | Discharge status: Home / Self Care | Payer: 59 | Source: Ambulatory Visit | Attending: Urology | Admitting: Urology

## 2016-08-12 ENCOUNTER — Ambulatory Visit (HOSPITAL_COMMUNITY): Payer: 59

## 2016-08-12 ENCOUNTER — Encounter (HOSPITAL_COMMUNITY): Payer: Self-pay | Admitting: *Deleted

## 2016-08-12 DIAGNOSIS — N132 Hydronephrosis with renal and ureteral calculous obstruction: Secondary | ICD-10-CM | POA: Insufficient documentation

## 2016-08-12 DIAGNOSIS — N201 Calculus of ureter: Secondary | ICD-10-CM | POA: Diagnosis present

## 2016-08-12 DIAGNOSIS — R310 Gross hematuria: Secondary | ICD-10-CM | POA: Insufficient documentation

## 2016-08-12 DIAGNOSIS — Z79899 Other long term (current) drug therapy: Secondary | ICD-10-CM | POA: Diagnosis not present

## 2016-08-12 DIAGNOSIS — Z9889 Other specified postprocedural states: Secondary | ICD-10-CM

## 2016-08-12 SURGERY — LITHOTRIPSY, ESWL
Anesthesia: LOCAL | Laterality: Right

## 2016-08-12 MED ORDER — DIAZEPAM 5 MG PO TABS
10.0000 mg | ORAL_TABLET | Freq: Once | ORAL | Status: AC
  Administered 2016-08-12: 10 mg via ORAL
  Filled 2016-08-12: qty 2

## 2016-08-12 MED ORDER — DIAZEPAM 5 MG PO TABS: 10.0000 mg | ORAL_TABLET | ORAL | Status: DC

## 2016-08-12 MED ORDER — SODIUM CHLORIDE 0.9 % IV SOLN
INTRAVENOUS | Status: DC
  Administered 2016-08-12: 07:00:00 via INTRAVENOUS

## 2016-08-12 MED ORDER — DIPHENHYDRAMINE HCL 25 MG PO TABS
25.0000 mg | ORAL_TABLET | Freq: Once | ORAL | Status: DC
  Filled 2016-08-12: qty 1

## 2016-08-12 MED ORDER — CIPROFLOXACIN HCL 500 MG PO TABS
500.0000 mg | ORAL_TABLET | Freq: Once | ORAL | Status: AC
  Administered 2016-08-12: 500 mg via ORAL
  Filled 2016-08-12: qty 1

## 2016-08-12 MED ORDER — SODIUM CHLORIDE 0.9 % IV SOLN: INTRAVENOUS | Status: DC

## 2016-08-12 MED ORDER — DIPHENHYDRAMINE HCL 25 MG PO CAPS: 25.0000 mg | ORAL_CAPSULE | ORAL | Status: DC

## 2016-08-12 MED ORDER — CIPROFLOXACIN HCL 500 MG PO TABS: 500.0000 mg | ORAL_TABLET | ORAL | Status: DC

## 2016-08-12 MED ORDER — DIPHENHYDRAMINE HCL 25 MG PO CAPS
25.0000 mg | ORAL_CAPSULE | Freq: Once | ORAL | Status: AC
  Administered 2016-08-12: 25 mg via ORAL
  Filled 2016-08-12: qty 1

## 2016-08-12 NOTE — H&P (Signed)
Office Visit Report     08/11/2016   --------------------------------------------------------------------------------   [name]  MRN: [id number]  PRIMARY CARE:  Bayard BeaverMichael Channing Badger, MD  DOB: Apr 25, 1961, 55 year old Female  REFERRING:  Bayard BeaverMichael Channing Badger, MD  SSN: [id number]  PROVIDER:  Marcine MatarStephen Dahlstedt, M.D.    TREATING:  Jetta Loutiane Warden    LOCATION:  [facility] [phone]   --------------------------------------------------------------------------------   CC: I have back pain.  HPI: [name] BreachLauren [name] is a 55 year-old female established patient who is here for back pain.  Has been on Prednisone X 1 week.  Had taken Diclofenac w/o relief.   The problem is on both sides. Her pain started about approximately 07/28/2016. Her back pain is on the right side. Patient denies left. The pain is sharp. The intensity of her pain is rated as a 5. The pain is constant. The pain does radiate.   Lying down< makes the pain better. Standing makes the pain worse. She has not been treated with any pain medications. She has had this same pain previously.     CC: I have blood in my urine.  HPI: She did see the blood in her urine. She first noticed the symptoms 08/11/2016. She has not seen blood clots.   She does not have a burning sensation when she urinates. She is not currently having trouble urinating.   She is having pain. She has not recently had unwanted weight loss.     ALLERGIES: Sulfa Drugs - Hives    MEDICATIONS: Daily Multiple Vitamins TABS Oral  Ginger CAPS Oral  Glucosamine TABS Oral  Omeprazole 20 MG Oral Tablet Delayed Release Oral  Prednisone  Turmeric Curcumin Oral Capsule Oral     GU PSH: D&C Non-OB - 2010 Hysterectomy Unilat SO - 10/28/2014 Renal ESWL - 2014    NON-GU PSH: Tubal Ligation - 2014    GU PMH: Kidney Stone, Nephrolithiasis - 01/18/2016, Kidney stone on right side, - 01/18/2016 Other microscopic hematuria, Microscopic hematuria -  12/08/2015 Calculus Ureter, Calculus of left ureter - 10/28/2014, Calculus of left ureter, - 2014, Calculus of distal right ureter, - 2014 Gross hematuria, Gross hematuria - 10/28/2014, Gross hematuria, - 2014 Hydronephrosis Unspec, Hydronephrosis, left - 2014      PMH Notes:  2013-01-17 14:26:39 - Note: Osteoarthritis  2013-01-17 14:26:39 - Note: Arthritis   NON-GU PMH: Encounter for general adult medical examination without abnormal findings, Encounter for preventive health examination - 12/08/2015 Personal history of other diseases of the musculoskeletal system and connective tissue, History of degenerative disc disease - 2014    FAMILY HISTORY: Death In The Family Father - Runs In Family Family Health Status Number - Runs In Family Hypertension - Runs In Family multiple sclerosis - Runs In Family   SOCIAL HISTORY: Marital Status: Married Current Smoking Status: Patient does not smoke anymore.  Does not use smokeless tobacco. Social Drinker.  Drinks 2 caffeinated drinks per day.    REVIEW OF SYSTEMS:    GU Review Female:   Patient reports frequent urination. Patient denies hard to postpone urination, burning /pain with urination, get up at night to urinate, leakage of urine, stream starts and stops, trouble starting your stream, have to strain to urinate, and currently pregnant.  Gastrointestinal (Upper):   Patient reports nausea. Patient denies vomiting and indigestion/ heartburn.  Gastrointestinal (Lower):   Patient reports diarrhea. Patient denies constipation.  Constitutional:   Patient denies fever, night sweats, weight loss, and fatigue.  Skin:  Patient denies skin rash/ lesion and itching.  Eyes:   Patient denies blurred vision and double vision.  Ears/ Nose/ Throat:   Patient denies sore throat and sinus problems.  Hematologic/Lymphatic:   Patient denies swollen glands and easy bruising.  Cardiovascular:   Patient denies leg swelling and chest pains.  Respiratory:    Patient denies cough and shortness of breath.  Endocrine:   Patient denies excessive thirst.  Musculoskeletal:   Patient reports back pain. Patient denies joint pain.  Neurological:   Patient denies headaches and dizziness.  Psychologic:   Patient denies depression and anxiety.   VITAL SIGNS:      08/11/2016 09:03 AM  Weight 170 lb / 77.11 kg  BP 166/94 mmHg  Heart Rate 68 /min  Temperature 97.9 F / 37 C   MULTI-SYSTEM PHYSICAL EXAMINATION:    Constitutional: Well-nourished. No physical deformities. Normally developed. Good grooming.   Respiratory: No labored breathing, no use of accessory muscles.   Cardiovascular: Normal temperature, normal extremity pulses, no swelling, no varicosities.   Neurologic / Psychiatric: Oriented to time, oriented to place, oriented to person. No depression, no anxiety, no agitation.   Gastrointestinal: No mass, no tenderness, no rigidity, non obese abdomen.   Ears, Nose, Mouth, and Throat: Class 3 oral airway.   Musculoskeletal: No CVA tenderness or SI joint pain with palpation.      PAST DATA REVIEWED:  Source Of History:  Patient  Lab Test Review:   BMP, Hypercalciura Profile  Records Review:   Previous Patient Records  Urine Test Review:   Urinalysis, 24 Hour Urine   PROCEDURES:         C.T. Urogram - [id number]      Bilateral renal calculi with stone burden right > left. Moderate right hydronephrosis and dilated ureter to level of distal 10-12 mm right ureteral calculus.   IMPRESSION:  1. Mild to moderate right-sided urinary tract obstruction secondary  to a distal right ureteric stone which measures maximally 11 mm.  2. Bilateral nephrolithiasis.  3. Aortic atherosclerosis.           Urinalysis w/Scope - 81001 Dipstick Dipstick Cont'd Micro  Specimen: Voided Bilirubin: Neg WBC/hpf: 0-5/hpf  Color: Amber Ketones: Neg RBC/hpf: >60/hpf  Appearance: Cloudy Blood: 3+ Bacteria: Rare  Specific Gravity: 1.015 Protein: Trace Cystals: NS (Not Seen)   pH: 6.0 Urobilinogen: 0.2 Casts: NS (Not Seen)  Glucose: Neg Nitrites: Neg Trichomonas: Not Present    Leukocyte Esterase: Neg Mucous: Not Present      Epithelial Cells: 0-5/hpf      Yeast: NS (Not Seen)      Sperm: Not Present    ASSESSMENT:      ICD-10 Details  1 GU:   Gross hematuria - R31.0 Distal right ureteral stone   2   Low back pain - M54.5 Chronic - Most likely related to distal right ureteral stone. Hydrocodone 7.5/300 mg 1-2 po Q6 hrs prn  3   Calculus Kidney and Ureter - N20.2 Bilateral, Worsening, Chronic - (R) distal 10-12 mm ureteral stone. Discussed with Dr. Retta Diones. Recommends ESWL. Tamsulosin 0.4 mg 1 po daily   PLAN:            Medications New Meds: Tamsulosin Hcl 0.4 mg capsule, ext release 24 hr 1 capsule PO Daily   #30  0 Refill(s)  Hydrocodone-Acetaminophen 7.5 mg-300 mg tablet 1-2 tablet PO Q 6 H PRN   #30  0 Refill(s)  Orders X-Rays: C.T. Stone Protocol Without Contrast          Schedule         Document Letter(s):  Created for Patient: Clinical Summary         Notes:   I went over the procedure of extracorporal shockwave lithotripsy with the patient in quite detail. She understands the risk of poor fragmentation resulting in further ureteral intervention. She also understands that possibility of needing additional procedures to clear stones. Finally, we discussed the risks of hematoma. The patient is willing to proceed.     Signed by Jetta Lout on 08/11/16 at 11:39 AM (EDT)     The information contained in this medical record document is considered private and confidential patient information. This information can only be used for the medical diagnosis and/or medical services that are being provided by the patient's selected caregivers. This information can only be distributed outside of the patient's care if the patient agrees and signs waivers of authorization for this information to be sent to an outside source or route.

## 2016-08-12 NOTE — Interval H&P Note (Cosign Needed)
Right ESWL. Marked & consented.

## 2016-08-12 NOTE — Discharge Instructions (Signed)
Dietary Guidelines to Help Prevent Kidney Stones Your risk of kidney stones can be decreased by adjusting the foods you eat. The most important thing you can do is drink enough fluid. You should drink enough fluid to keep your urine clear or pale yellow. The following guidelines provide specific information for the type of kidney stone you have had. GUIDELINES ACCORDING TO TYPE OF KIDNEY STONE Calcium Oxalate Kidney Stones  Reduce the amount of salt you eat. Foods that have a lot of salt cause your body to release excess calcium into your urine. The excess calcium can combine with a substance called oxalate to form kidney stones.  Reduce the amount of animal protein you eat if the amount you eat is excessive. Animal protein causes your body to release excess calcium into your urine. Ask your dietitian how much protein from animal sources you should be eating.  Avoid foods that are high in oxalates. If you take vitamins, they should have less than 500 mg of vitamin C. Your body turns vitamin C into oxalates. You do not need to avoid fruits and vegetables high in vitamin C. Calcium Phosphate Kidney Stones  Reduce the amount of salt you eat to help prevent the release of excess calcium into your urine.  Reduce the amount of animal protein you eat if the amount you eat is excessive. Animal protein causes your body to release excess calcium into your urine. Ask your dietitian how much protein from animal sources you should be eating.  Get enough calcium from food or take a calcium supplement (ask your dietitian for recommendations). Food sources of calcium that do not increase your risk of kidney stones include:  Broccoli.  Dairy products, such as cheese and yogurt.  Pudding. Uric Acid Kidney Stones  Do not have more than 6 oz of animal protein per day. FOOD SOURCES Animal Protein Sources  Meat (all types).  Poultry.  Eggs.  Fish, seafood. Foods High in [employer]  seasonings.  Soy sauce.  Teriyaki sauce.  Cured and processed meats.  Salted crackers and snack foods.  Fast food.  Canned soups and most canned foods. Foods High in Oxalates  Grains:  Amaranth.  Barley.  Grits.  Wheat germ.  Bran.  Buckwheat flour.  All bran cereals.  Pretzels.  Whole wheat bread.  Vegetables:  Beans (wax).  Beets and beet greens.  Collard greens.  Eggplant.  Escarole.  Leeks.  Okra.  Parsley.  Rutabagas.  Spinach.  Swiss chard.  Tomato paste.  Fried potatoes.  Sweet potatoes.  Fruits:  Red currants.  Figs.  Kiwi.  Rhubarb.  Meat and Other Protein Sources:  Beans (dried).  Soy burgers and other soybean products.  Miso.  Nuts (peanuts, almonds, pecans, cashews, hazelnuts).  Nut butters.  Sesame seeds and tahini (paste made of sesame seeds).  Poppy seeds.  Beverages:  Chocolate drink mixes.  Soy milk.  Instant iced tea.  Juices made from high-oxalate fruits or vegetables.  Other:  Carob.  Chocolate.  Fruitcake.  Marmalades.   This information is not intended to replace advice given to you by your health care provider. Make sure you discuss any questions you have with your health care provider.   Document Released: 02/26/2011 Document Revised: 11/06/2013 Document Reviewed: 09/28/2013 Elsevier Interactive Patient Education 2016 [employer]. Lithotripsy, Care After Refer to this sheet in the next few weeks. These instructions provide you with information on caring for yourself after your procedure. Your health care provider may also give  you more specific instructions. Your treatment has been planned according to current medical practices, but problems sometimes occur. Call your health care provider if you have any problems or questions after your procedure. WHAT TO EXPECT AFTER THE PROCEDURE   Your urine may have a red tinge for a few days after treatment. Blood loss is usually  minimal.  You may have soreness in the back or flank area. This usually goes away after a few days. The procedure can cause blotches or bruises on the back where the pressure wave enters the skin. These marks usually cause only minimal discomfort and should disappear in a short time.  Stone fragments should begin to pass within 24 hours of treatment. However, a delayed passage is not unusual.  You may have pain, discomfort, and feel sick to your stomach (nauseated) when the crushed fragments of stone are passed down the tube from the kidney to the bladder. Stone fragments can pass soon after the procedure and may last for up to 4-8 weeks.  A small number of patients may have severe pain when stone fragments are not able to pass, which leads to an obstruction.  If your stone is greater than 1 inch (2.5 cm) in diameter or if you have multiple stones that have a combined diameter greater than 1 inch (2.5 cm), you may require more than one treatment.  If you had a stent placed prior to your procedure, you may experience some discomfort, especially during urination. You may experience the pain or discomfort in your flank or back, or you may experience a sharp pain or discomfort at the base of your penis or in your lower abdomen. The discomfort usually lasts only a few minutes after urinating. HOME CARE INSTRUCTIONS   Rest at home until you feel your energy improving.  Only take over-the-counter or prescription medicines for pain, discomfort, or fever as directed by your health care provider. Depending on the type of lithotripsy, you may need to take antibiotics and anti-inflammatory medicines for a few days.  Drink enough water and fluids to keep your urine clear or pale yellow. This helps "flush" your kidneys. It helps pass any remaining pieces of stone and prevents stones from coming back.  Most people can resume daily activities within 1-2 days after standard lithotripsy. It can take longer to  recover from laser and percutaneous lithotripsy.  Strain all urine through the provided strainer. Keep all particulate matter and stones for your health care provider to see. The stone may be as small as a grain of salt. It is very important to use the strainer each and every time you pass your urine. Any stones that are found can be sent to a medical lab for examination.  Visit your health care provider for a follow-up appointment in a few weeks. Your doctor may remove your stent if you have one. Your health care provider will also check to see whether stone particles still remain. SEEK MEDICAL CARE IF:   Your pain is not relieved by medicine.  You have a lasting nauseous feeling.  You feel there is too much blood in the urine.  You develop persistent problems with frequent or painful urination that does not at least partially improve after 2 days following the procedure.  You have a congested cough.  You feel lightheaded.  You develop a rash or any other signs that might suggest an allergic problem.  You develop any reaction or side effects to your medicine(s). SEEK IMMEDIATE MEDICAL CARE  IF:   You experience severe back or flank pain or both.  You see nothing but blood when you urinate.  You cannot pass any urine at all.  You have a fever or shaking chills.  You develop shortness of breath, difficulty breathing, or chest pain.  You develop vomiting that will not stop after 6-8 hours.  You have a fainting episode.   This information is not intended to replace advice given to you by your health care provider. Make sure you discuss any questions you have with your health care provider.   Document Released: 11/21/2007 Document Revised: 07/23/2015 Document Reviewed: 05/17/2013 Elsevier Interactive Patient Education [employer]2016 [employer] Inc.

## 2017-11-21 DIAGNOSIS — M65352 Trigger finger, left little finger: Secondary | ICD-10-CM | POA: Insufficient documentation

## 2018-03-01 ENCOUNTER — Other Ambulatory Visit: Payer: Self-pay | Admitting: Physician Assistant

## 2018-03-01 DIAGNOSIS — K219 Gastro-esophageal reflux disease without esophagitis: Secondary | ICD-10-CM

## 2018-03-02 ENCOUNTER — Ambulatory Visit
Admission: RE | Admit: 2018-03-02 | Discharge: 2018-03-02 | Disposition: A | Discharge status: Home / Self Care | Payer: 59 | Source: Ambulatory Visit | Attending: Physician Assistant | Admitting: Physician Assistant

## 2018-03-02 DIAGNOSIS — K219 Gastro-esophageal reflux disease without esophagitis: Secondary | ICD-10-CM

## 2018-04-21 DIAGNOSIS — K219 Gastro-esophageal reflux disease without esophagitis: Secondary | ICD-10-CM | POA: Diagnosis not present

## 2018-04-21 DIAGNOSIS — Z1211 Encounter for screening for malignant neoplasm of colon: Secondary | ICD-10-CM | POA: Diagnosis not present

## 2018-08-09 DIAGNOSIS — N200 Calculus of kidney: Secondary | ICD-10-CM | POA: Diagnosis not present

## 2018-12-21 DIAGNOSIS — Z6828 Body mass index (BMI) 28.0-28.9, adult: Secondary | ICD-10-CM | POA: Diagnosis not present

## 2018-12-21 DIAGNOSIS — Z01419 Encounter for gynecological examination (general) (routine) without abnormal findings: Secondary | ICD-10-CM | POA: Diagnosis not present

## 2018-12-28 DIAGNOSIS — H524 Presbyopia: Secondary | ICD-10-CM | POA: Diagnosis not present

## 2019-01-09 ENCOUNTER — Ambulatory Visit: Payer: 59 | Admitting: Family Medicine

## 2019-01-16 ENCOUNTER — Ambulatory Visit: Payer: 59 | Admitting: Family Medicine

## 2019-01-16 ENCOUNTER — Encounter: Payer: Self-pay | Admitting: Family Medicine

## 2019-01-16 ENCOUNTER — Other Ambulatory Visit: Payer: Self-pay

## 2019-01-16 VITALS — BP 126/74 | HR 83 | Temp 98.1°F | Resp 16 | Ht 63.5 in | Wt 167.8 lb

## 2019-01-16 DIAGNOSIS — K219 Gastro-esophageal reflux disease without esophagitis: Secondary | ICD-10-CM | POA: Insufficient documentation

## 2019-01-16 DIAGNOSIS — Z0000 Encounter for general adult medical examination without abnormal findings: Secondary | ICD-10-CM | POA: Diagnosis not present

## 2019-01-16 DIAGNOSIS — Z23 Encounter for immunization: Secondary | ICD-10-CM

## 2019-01-16 DIAGNOSIS — M47816 Spondylosis without myelopathy or radiculopathy, lumbar region: Secondary | ICD-10-CM | POA: Insufficient documentation

## 2019-01-16 NOTE — Progress Notes (Signed)
Subjective  CC:  Chief Complaint  Patient presents with  . Establish Care    Last CPE was 2018, Previous pt at [facility]  . Gastroesophageal Reflux    Takes Omeprazole 20mg     HPI: [name] [name] is a 58 y.o. female who presents to [facility] today to establish care with me as a new patient.   She has the following concerns or needs:  Very pleasant 58 year old female, married, mother of 2 grown boys presents to establish care.  Last physical was in 2018.  She is nonfasting today.  Unfortunately, her mother passed away this morning.  She was in the care of hospice at [facility].  She was diagnosed with ovarian cancer 6 weeks ago.  Patient is relieved to see that her mother suffering is has ended  Patient has history of nephrolithiasis that has been managed by urology.  She does have medicines to be used as needed.  Also has well-controlled GERD on chronic PPIs.  She sees a gastroenterologist.  Female wellness visits are done by gynecology.  She reports a normal mammogram.  She is status post hysterectomy.  She saw Dr. Marcelle Overlie in February.  I did review his note.  Assessment  1. Annual physical exam   2. Gastroesophageal reflux disease, esophagitis presence not specified      Plan  Female Wellness Visit:  Age appropriate Health Maintenance and Prevention measures were discussed with patient. Included topics are cancer screening recommendations, ways to keep healthy (see AVS) including dietary and exercise recommendations, regular eye and dental care, use of seat belts, and avoidance of moderate alcohol use and tobacco use.  Cancer screenings are up-to-date.  BMI: discussed patient's BMI and encouraged positive lifestyle modifications to help get to or maintain a target BMI.  HM needs and immunizations were addressed and ordered. See below for orders. See HM and immunization section for updates.  Routine labs and screening tests ordered including  cmp, cbc and lipids where appropriate.  Discussed recommendations regarding Vit D and calcium supplementation (see AVS)  GERD is well controlled.  Condolences for her recent loss given.  Patient seems to be coping very well.    Follow up:  No follow-ups on file. Orders Placed This Encounter  Procedures  . Varicella-zoster vaccine IM (Shingrix)  . Hepatitis C antibody  . CBC with Differential/Platelet  . Comprehensive metabolic panel  . HIV Antibody (routine testing w rflx)  . Lipid panel   No orders of the defined types were placed in this encounter.    Depression screen PHQ 2/9 01/16/2019  Decreased Interest 0  Down, Depressed, Hopeless 0  PHQ - 2 Score 0    We updated and reviewed the patient's past history in detail and it is documented below.  Patient Active Problem List   Diagnosis Date Noted  . DJD (degenerative joint disease), lumbar 01/16/2019  . GERD (gastroesophageal reflux disease) 01/16/2019    Dr. Dulce Sellar   . Trigger little finger of left hand 11/21/2017  . Nephrolithiasis 11/02/2013    Overview:  H/o recurrent. Bilateral renal pelvic stones present c/w persistent microscopic hematuria. [facility]   . Subcutaneous cysts, generalized 05/13/2011    Right wrist, back, right thigh - all resolved/excised in past    Health Maintenance  Topic Date Due  . Hepatitis C Screening  [birth year]  . HIV Screening  03/22/1976  . MAMMOGRAM  03/23/2011  . COLONOSCOPY  03/23/2011  . INFLUENZA VACCINE  02/13/2019 (Originally  06/15/2018)  . TETANUS/TDAP  02/16/2027   Immunization History  Administered Date(s) Administered  . Tdap 02/15/2017  . Zoster Recombinat (Shingrix) 01/16/2019   No outpatient medications have been marked as taking for the 01/16/19 encounter (Office Visit) with Willow Ora, MD.    Allergies: Patient is allergic to other and sulfa antibiotics. Past Medical History Patient  has a past medical history of GERD (gastroesophageal reflux  disease), History of kidney stones, kidney stones, and SVD (spontaneous vaginal delivery). Past Surgical History Patient  has a past surgical history that includes Tubal ligation (1993); Dilation and curettage of uterus; Spine surgery; Wisdom tooth extraction; Colonoscopy; lithotripsy; laparoscopy (N/A, 09/19/2014); Vaginal hysterectomy (N/A, 09/19/2014); and Anterior and posterior repair with sacrospinous fixation (N/A, 09/19/2014). Family History: Patient family history includes Drug abuse in her son; Healthy in her sister; Hypertension in her mother; Ovarian cancer in her mother. Social History:  Patient  reports that she quit smoking about 4 years ago. She has a 30.00 pack-year smoking history. She has never used smokeless tobacco. She reports current alcohol use. She reports that she does not use drugs.  Review of Systems: Constitutional: negative for fever or malaise Ophthalmic: negative for photophobia, double vision or loss of vision Cardiovascular: negative for chest pain, dyspnea on exertion, or new LE swelling Respiratory: negative for SOB or persistent cough Gastrointestinal: negative for abdominal pain, change in bowel habits or melena Genitourinary: negative for dysuria or gross hematuria Musculoskeletal: negative for new gait disturbance or muscular weakness Integumentary: negative for new or persistent rashes Neurological: negative for TIA or stroke symptoms Psychiatric: negative for SI or delusions Allergic/Immunologic: negative for hives  Patient Care Team    Relationship Specialty Notifications Start End  Willow Ora, MD PCP - General Family Medicine  01/16/19     Objective  Vitals: BP 126/74   Pulse 83   Temp 98.1 F (36.7 C) (Oral)   Resp 16   Ht 5' 3.5" (1.613 m)   Wt 167 lb 12.8 oz (76.1 kg)   SpO2 94%   BMI 29.26 kg/m  General:  Well developed, well nourished, no acute distress  Psych:  Alert and oriented,normal mood and affect HEENT:  Normocephalic,  atraumatic, non-icteric sclera, PERRL, oropharynx is without mass or exudate, supple neck without adenopathy, mass or thyromegaly Cardiovascular:  RRR without gallop, rub or murmur, nondisplaced PMI Respiratory:  Good breath sounds bilaterally, CTAB with normal respiratory effort Gastrointestinal: normal bowel sounds, soft, non-tender, no noted masses. No HSM MSK: no deformities, contusions. Joints are without erythema or swelling Skin:  Warm, no rashes or suspicious lesions noted Neurologic:    Mental status is normal. Gross motor and sensory exams are normal. Normal gait   Commons side effects, risks, benefits, and alternatives for medications and treatment plan prescribed today were discussed, and the patient expressed understanding of the given instructions. Patient is instructed to call or message via MyChart if he/she has any questions or concerns regarding our treatment plan. No barriers to understanding were identified. We discussed Red Flag symptoms and signs in detail. Patient expressed understanding regarding what to do in case of urgent or emergency type symptoms.   Medication list was reconciled, printed and provided to the patient in AVS. Patient instructions and summary information was reviewed with the patient as documented in the AVS. This note was prepared with assistance of Dragon voice recognition software. Occasional wrong-word or sound-a-like substitutions may have occurred due to the inherent limitations of voice recognition software

## 2019-01-16 NOTE — Patient Instructions (Signed)
Please return in 12 months for your annual complete physical; please come fasting.  I will release your lab results to you on your MyChart account with further instructions. Please reply with any questions. Please sign up.  Today you were given your Shingrix #1 of 2 vaccination.    It was a pleasure meeting you today! Thank you for choosing [place] to meet your healthcare needs! I truly look forward to working with you. If you have any questions or concerns, please send me a message via Mychart or call the office at [phone].  Please do these things to maintain good health!   Exercise at least 30-45 minutes a day,  4-5 days a week.   Eat a low-fat diet with lots of fruits and vegetables, up to 7-9 servings per day.  Drink plenty of water daily. Try to drink 8 8oz glasses per day.  Seatbelts can save your life. Always wear your seatbelt.  Place Smoke Detectors on every level of your home and check batteries every year.  Schedule an appointment with an eye doctor for an eye exam every 1-2 years  Safe sex - use condoms to protect yourself from STDs if you could be exposed to these types of infections. Use birth control if you do not want to become pregnant and are sexually active.  Avoid heavy alcohol use. If you drink, keep it to less than 2 drinks/day and not every day.  Health Care Power of Attorney.  Choose someone you trust that could speak for you if you became unable to speak for yourself.  Depression is common in our stressful world.If you're feeling down or losing interest in things you normally enjoy, please come in for a visit.  If anyone is threatening or hurting you, please get help. Physical or Emotional Violence is never OK.

## 2019-01-17 ENCOUNTER — Encounter: Payer: Self-pay | Admitting: *Deleted

## 2019-01-17 LAB — CBC WITH DIFFERENTIAL/PLATELET
Absolute Monocytes: 624 cells/uL (ref 200–950)
BASOS ABS: 49 {cells}/uL (ref 0–200)
Basophils Relative: 0.6 %
EOS ABS: 186 {cells}/uL (ref 15–500)
Eosinophils Relative: 2.3 %
HEMATOCRIT: 43.3 % (ref 35.0–45.0)
HEMOGLOBIN: 14.9 g/dL (ref 11.7–15.5)
Lymphs Abs: 2973 cells/uL (ref 850–3900)
MCH: 31.3 pg (ref 27.0–33.0)
MCHC: 34.4 g/dL (ref 32.0–36.0)
MCV: 91 fL (ref 80.0–100.0)
MONOS PCT: 7.7 %
MPV: 12.4 fL (ref 7.5–12.5)
NEUTROS ABS: 4269 {cells}/uL (ref 1500–7800)
NEUTROS PCT: 52.7 %
Platelets: 270 10*3/uL (ref 140–400)
RBC: 4.76 10*6/uL (ref 3.80–5.10)
RDW: 12.4 % (ref 11.0–15.0)
Total Lymphocyte: 36.7 %
WBC: 8.1 10*3/uL (ref 3.8–10.8)

## 2019-01-17 LAB — COMPREHENSIVE METABOLIC PANEL
AG RATIO: 1.7 (calc) (ref 1.0–2.5)
ALT: 18 U/L (ref 6–29)
AST: 27 U/L (ref 10–35)
Albumin: 4.4 g/dL (ref 3.6–5.1)
Alkaline phosphatase (APISO): 88 U/L (ref 37–153)
BILIRUBIN TOTAL: 0.4 mg/dL (ref 0.2–1.2)
BUN: 19 mg/dL (ref 7–25)
CALCIUM: 9.6 mg/dL (ref 8.6–10.4)
CO2: 27 mmol/L (ref 20–32)
Chloride: 105 mmol/L (ref 98–110)
Creat: 0.79 mg/dL (ref 0.50–1.05)
GLUCOSE: 100 mg/dL — AB (ref 65–99)
Globulin: 2.6 g/dL (calc) (ref 1.9–3.7)
Potassium: 3.9 mmol/L (ref 3.5–5.3)
Sodium: 141 mmol/L (ref 135–146)
Total Protein: 7 g/dL (ref 6.1–8.1)

## 2019-01-17 LAB — LIPID PANEL
CHOLESTEROL: 203 mg/dL — AB (ref <200)
HDL: 77 mg/dL (ref >=50)
LDL Cholesterol (Calc): 99 mg/dL (calc)
Non-HDL Cholesterol (Calc): 126 mg/dL (calc) (ref <130)
Total CHOL/HDL Ratio: 2.6 (calc) (ref <5.0)
Triglycerides: 176 mg/dL — ABNORMAL HIGH (ref <150)

## 2019-01-17 LAB — HIV ANTIBODY (ROUTINE TESTING W REFLEX): HIV 1&2 Ab, 4th Generation: NONREACTIVE

## 2019-01-17 LAB — HEPATITIS C ANTIBODY
HEP C AB: NONREACTIVE
SIGNAL TO CUT-OFF: 0.04 (ref <1.00)

## 2019-01-18 ENCOUNTER — Encounter: Payer: Self-pay | Admitting: Family Medicine

## 2019-01-18 NOTE — Progress Notes (Signed)
Lab results mailed to patient in letter. Normal results. No action / follow up needed on these results.  

## 2019-01-18 NOTE — Progress Notes (Signed)
I have reviewed results. Normal. Patient notified by letter. Please see letter for details. 

## 2019-02-14 ENCOUNTER — Ambulatory Visit (INDEPENDENT_AMBULATORY_CARE_PROVIDER_SITE_OTHER): Payer: 59

## 2019-02-14 DIAGNOSIS — Z23 Encounter for immunization: Secondary | ICD-10-CM | POA: Diagnosis not present

## 2019-02-14 NOTE — Progress Notes (Signed)
Administered second and final SHINGRIX vaccine IM left arm. Patient tolerated well.

## 2019-04-04 ENCOUNTER — Encounter: Payer: Self-pay | Admitting: Family Medicine

## 2019-04-04 ENCOUNTER — Ambulatory Visit: Payer: 59 | Admitting: Family Medicine

## 2019-04-04 ENCOUNTER — Other Ambulatory Visit: Payer: Self-pay

## 2019-04-04 VITALS — BP 126/70 | HR 67 | Temp 98.0°F | Resp 16 | Ht 63.5 in | Wt 161.2 lb

## 2019-04-04 DIAGNOSIS — R21 Rash and other nonspecific skin eruption: Secondary | ICD-10-CM

## 2019-04-04 MED ORDER — PREDNISONE 10 MG PO TABS: ORAL_TABLET | ORAL | 0 refills | Status: DC

## 2019-04-04 MED ORDER — PERMETHRIN 5 % EX CREA: 1.0000 "application " | TOPICAL_CREAM | Freq: Once | CUTANEOUS | 0 refills | Status: AC

## 2019-04-04 NOTE — Progress Notes (Signed)
Subjective  CC:  Chief Complaint  Patient presents with  . Rash    Coming and going for sometime. Started under the arm.. She states that it itches, red, and spotty. She has tried Clobetasol cream with minimal relief.     HPI: [name] [name] is a 58 y.o. female who presents to the office today to address the problems listed above in the chief complaint.  58 yo reports rash that comes and goes; started in January on back of both legs: red small bumps that itch. She used clobetasol which helps but doesn't resolve it. Then over weekend, has rash on back of neck, upper back and it is very itchy. She also noted that when she sweats, she gets rash beneath underarms and back of arms. It is present now. On xyzal for allergies w/o relief. No urticaria or vesicles. No pain. Has a few areas on fingers as well. No systemic sxs. No recent travel. No affected family members. No h/o eczema or atopic dermatitis. Has never had poison ivy before.    Assessment  1. Rash and nonspecific skin eruption      Plan   Nonspecific rash:  Scabies is on differential so will treat. Educated appropriately. Or could be allergic: trial of pred if not improving. F/u if neede.d   Follow up: No follow-ups on file.  Visit date not found  No orders of the defined types were placed in this encounter.  Meds ordered this encounter  Medications  . permethrin (ELIMITE) 5 % cream    Sig: Apply 1 application topically once for 1 dose.    Dispense:  60 g    Refill:  0  . predniSONE (DELTASONE) 10 MG tablet    Sig: Take 4 tabs qd x 2 days, 3 qd x 2 days, 2 qd x 2d, 1qd x 3 days    Dispense:  21 tablet    Refill:  0      I reviewed the patients updated PMH, FH, and SocHx.    Patient Active Problem List   Diagnosis Date Noted  . DJD (degenerative joint disease), lumbar 01/16/2019  . GERD (gastroesophageal reflux disease) 01/16/2019  . Trigger little finger of left hand 11/21/2017  . Nephrolithiasis 11/02/2013  .  Subcutaneous cysts, generalized 05/13/2011   Current Meds  Medication Sig  . Glucosamine HCl (GLUCOSAMINE PO) Take 4,000 mg by mouth daily.  . Multiple Vitamin (MULTIVITAMIN) tablet Take 1 tablet by mouth daily.  Marland Kitchen omeprazole (PRILOSEC) 20 MG capsule Take 20 mg by mouth daily.  . Turmeric 500 MG CAPS Take 500 mg by mouth daily.    Allergies: Patient is allergic to sulfa antibiotics. Family History: Patient family history includes Alcohol abuse in her maternal grandfather, maternal grandmother, and paternal grandfather; Cancer in her paternal grandmother; Drug abuse in her son; Healthy in her sister; Heart attack in her paternal grandfather; High Cholesterol in her mother; Hypertension in her mother; Ovarian cancer in her mother. Social History:  Patient  reports that she quit smoking about 4 years ago. She has a 30.00 pack-year smoking history. She has never used smokeless tobacco. She reports current alcohol use. She reports that she does not use drugs.  Review of Systems: Constitutional: Negative for fever malaise or anorexia Cardiovascular: negative for chest pain Respiratory: negative for SOB or persistent cough Gastrointestinal: negative for abdominal pain  Objective  Vitals: BP 126/70   Pulse 67   Temp 98 F (36.7 C) (Oral)   Resp 16  Ht 5' 3.5" (1.613 m)   Wt 161 lb 3.2 oz (73.1 kg)   SpO2 97%   BMI 28.11 kg/m  General: no acute distress but scratching, A&Ox3 HEENT: PEERL, conjunctiva normal, Oropharynx moist,neck is supple Cardiovascular:  RRR without murmur or gallop.  Respiratory:  Good breath sounds bilaterally, CTAB with normal respiratory effort Skin:  Warm, upper posterior neck, back of arms with maculopapular rash. Some linear lesion. No vesicles. No urticaria. No flaking. No pustules. No abrasions.      Commons side effects, risks, benefits, and alternatives for medications and treatment plan prescribed today were discussed, and the patient expressed  understanding of the given instructions. Patient is instructed to call or message via MyChart if he/she has any questions or concerns regarding our treatment plan. No barriers to understanding were identified. We discussed Red Flag symptoms and signs in detail. Patient expressed understanding regarding what to do in case of urgent or emergency type symptoms.   Medication list was reconciled, printed and provided to the patient in AVS. Patient instructions and summary information was reviewed with the patient as documented in the AVS. This note was prepared with assistance of Dragon voice recognition software. Occasional wrong-word or sound-a-like substitutions may have occurred due to the inherent limitations of voice recognition software

## 2019-04-23 ENCOUNTER — Encounter: Payer: Self-pay | Admitting: Family Medicine

## 2019-04-23 DIAGNOSIS — R21 Rash and other nonspecific skin eruption: Secondary | ICD-10-CM

## 2019-04-27 ENCOUNTER — Ambulatory Visit: Payer: 59 | Admitting: Family Medicine

## 2019-04-27 ENCOUNTER — Encounter: Payer: Self-pay | Admitting: Family Medicine

## 2019-04-27 ENCOUNTER — Other Ambulatory Visit: Payer: Self-pay

## 2019-04-27 VITALS — BP 124/64 | HR 91 | Temp 98.2°F | Resp 16 | Wt 170.6 lb

## 2019-04-27 DIAGNOSIS — R21 Rash and other nonspecific skin eruption: Secondary | ICD-10-CM

## 2019-04-27 MED ORDER — HYDROXYZINE PAMOATE 25 MG PO CAPS: 25.0000 mg | ORAL_CAPSULE | Freq: Three times a day (TID) | ORAL | 2 refills | Status: DC | PRN

## 2019-04-27 NOTE — Patient Instructions (Signed)
Please follow up if symptoms do not improve or as needed.   Let's see what the dermatologist has to say.  Use the hydroxyzine as needed for itching. You may use topical benadryl to the itchy spots as well.  Continue the xyzal daily.    Rash, Adult A rash is a change in the color of your skin. A rash can also change the way your skin feels. There are many different conditions and factors that can cause a rash. Some rashes may disappear after a few days, but some may last for a few weeks. Common causes of rashes include:  Viral infections, such as: ? Colds. ? Measles. ? Hand, foot, and mouth disease.  Bacterial infections, such as: ? Scarlet fever. ? Impetigo.  Fungal infections, such as Candida.  Allergic reactions to food, medicines, or skin care products. Follow these instructions at home: The goal of treatment is to stop the itching and keep the rash from spreading. Pay attention to any changes in your symptoms. Follow these instructions to help with your condition: Medicine Take or apply over-the-counter and prescription medicines only as told by your health care provider. These may include:  Corticosteroid creams to treat red or swollen skin.  Anti-itch lotions.  Oral allergy medicines (antihistamines).  Oral corticosteroids for severe symptoms.  Skin care  Apply cool compresses to the affected areas.  Do not scratch or rub your skin.  Avoid covering the rash. Make sure the rash is exposed to air as much as possible. Managing itching and discomfort  Avoid hot showers or baths, which can make itching worse. A cold shower may help.  Try taking a bath with: ? Epsom salts. Follow manufacturer instructions on the packaging. You can get these at your local pharmacy or grocery store. ? Baking soda. Pour a small amount into the bath as told by your health care provider. ? Colloidal oatmeal. Follow manufacturer instructions on the packaging. You can get this at your local  pharmacy or grocery store.  Try applying baking soda paste to your skin. Stir water into baking soda until it reaches a paste-like consistency.  Try applying calamine lotion. This is an over-the-counter lotion that helps to relieve itchiness.  Keep cool and out of the sun. Sweating and being hot can make itching worse. General instructions   Rest as needed.  Drink enough fluid to keep your urine pale yellow.  Wear loose-fitting clothing.  Avoid scented soaps, detergents, and perfumes. Use gentle soaps, detergents, perfumes, and other cosmetic products.  Avoid any substance that causes your rash. Keep a journal to help track what causes your rash. Write down: ? What you eat. ? What cosmetic products you use. ? What you drink. ? What you wear. This includes jewelry.  Keep all follow-up visits as told by your health care provider. This is important. Contact a health care provider if:  You sweat at night.  You lose weight.  You urinate more than normal.  You urinate less than normal, or you notice that your urine is a darker color than usual.  You feel weak.  You vomit.  Your skin or the whites of your eyes look yellow (jaundice).  Your skin: ? Tingles. ? Is numb.  Your rash: ? Does not go away after several days. ? Gets worse.  You are: ? Unusually thirsty. ? More tired than normal.  You have: ? New symptoms. ? Pain in your abdomen. ? A fever. ? Diarrhea. Get help right away if you:  Have a fever and your symptoms suddenly get worse.  Develop confusion.  Have a severe headache or a stiff neck.  Have severe joint pains or stiffness.  Have a seizure.  Develop a rash that covers all or most of your body. The rash may or may not be painful.  Develop blisters that: ? Are on top of the rash. ? Grow larger or grow together. ? Are painful. ? Are inside your nose or mouth.  Develop a rash that: ? Looks like purple pinprick-sized spots all over your  body. ? Has a "bull's eye" or looks like a target. ? Is not related to sun exposure, is red and painful, and causes your skin to peel. Summary  A rash is a change in the color of your skin. Some rashes disappear after a few days, but some may last for a few weeks.  The goal of treatment is to stop the itching and keep the rash from spreading.  Take or apply over-the-counter and prescription medicines only as told by your health care provider.  Contact a health care provider if you have new or worsening symptoms.  Keep all follow-up visits as told by your health care provider. This is important. This information is not intended to replace advice given to you by your health care provider. Make sure you discuss any questions you have with your health care provider. Document Released: 10/22/2002 Document Revised: 06/05/2018 Document Reviewed: 06/05/2018 Elsevier Interactive Patient Education  2019 [employer].

## 2019-04-27 NOTE — Progress Notes (Signed)
Subjective  CC:  Chief Complaint  Patient presents with   Rash    Getting worse, spreading all over.. Prednisone helped with itching.    HPI: [name] [name] is a 58 y.o. female who presents to the office today to address the problems listed above in the chief complaint.  See last note. Worsening of rash since stopping pred. Reports pred helped with itching but not the rash. Has red itching rash diffusely. Started back in January. No systemic sxs.   Assessment  1. Rash and nonspecific skin eruption      Plan   Non specific rash:  Unclear etiology. Topical benadryl, hydroxyzine, xyzal and refer to derm for help. ? Urticarial or allergic or other.   Follow up: prn  Visit date not found  No orders of the defined types were placed in this encounter.  Meds ordered this encounter  Medications   hydrOXYzine (VISTARIL) 25 MG capsule    Sig: Take 1 capsule (25 mg total) by mouth 3 (three) times daily as needed for itching.    Dispense:  30 capsule    Refill:  2      I reviewed the patients updated PMH, FH, and SocHx.    Patient Active Problem List   Diagnosis Date Noted   DJD (degenerative joint disease), lumbar 01/16/2019   GERD (gastroesophageal reflux disease) 01/16/2019   Trigger little finger of left hand 11/21/2017   Nephrolithiasis 11/02/2013   Subcutaneous cysts, generalized 05/13/2011   Current Meds  Medication Sig   Glucosamine HCl (GLUCOSAMINE PO) Take 4,000 mg by mouth daily.   Multiple Vitamin (MULTIVITAMIN) tablet Take 1 tablet by mouth daily.   omeprazole (PRILOSEC) 20 MG capsule Take 20 mg by mouth daily.    Allergies: Patient is allergic to sulfa antibiotics. Family History: Patient family history includes Alcohol abuse in her maternal grandfather, maternal grandmother, and paternal grandfather; Cancer in her paternal grandmother; Drug abuse in her son; Healthy in her sister; Heart attack in her paternal grandfather; High Cholesterol in her  mother; Hypertension in her mother; Ovarian cancer in her mother. Social History:  Patient  reports that she quit smoking about 5 years ago. She has a 30.00 pack-year smoking history. She has never used smokeless tobacco. She reports current alcohol use. She reports that she does not use drugs.  Review of Systems: Constitutional: Negative for fever malaise or anorexia Cardiovascular: negative for chest pain Respiratory: negative for SOB or persistent cough Gastrointestinal: negative for abdominal pain  Objective  Vitals: BP 124/64    Pulse 91    Temp 98.2 F (36.8 C) (Oral)    Resp 16    Wt 170 lb 9.6 oz (77.4 kg)    SpO2 97%    BMI 29.75 kg/m  General: no acute distress , A&Ox3 Skin: diffuse papular rash but in patches. Excoriations. Nonblanchable.      Commons side effects, risks, benefits, and alternatives for medications and treatment plan prescribed today were discussed, and the patient expressed understanding of the given instructions. Patient is instructed to call or message via MyChart if he/she has any questions or concerns regarding our treatment plan. No barriers to understanding were identified. We discussed Red Flag symptoms and signs in detail. Patient expressed understanding regarding what to do in case of urgent or emergency type symptoms.   Medication list was reconciled, printed and provided to the patient in AVS. Patient instructions and summary information was reviewed with the patient as documented in the AVS. This  note was prepared with assistance of Conservation officer, historic buildingsDragon voice recognition software. Occasional wrong-word or sound-a-like substitutions may have occurred due to the inherent limitations of voice recognition software

## 2019-06-24 IMAGING — RF DG UGI W/ HIGH DENSITY W/KUB
11 of 14 series · 14 of 24 positions shown · non-contrast
Comparison: [HOSPITAL] CT Abdomen and Pelvis
08/11/2016.

CLINICAL DATA: 56-year-old female with heartburn, reflux disease.

EXAM:
UPPER GI SERIES WITH KUB
TECHNIQUE: After obtaining a scout radiograph a routine upper GI series was
performed using effervescent crystals and barium
FLUOROSCOPY TIME:  Fluoroscopy Time:  2 minutes 6 seconds
Radiation Exposure Index (if provided by the fluoroscopic device):
197 mGy
Number of Acquired Spot Images: 0

[Series 1: one shot · 1 of 1 slices shown (1 of 5)]
[im 1/1]
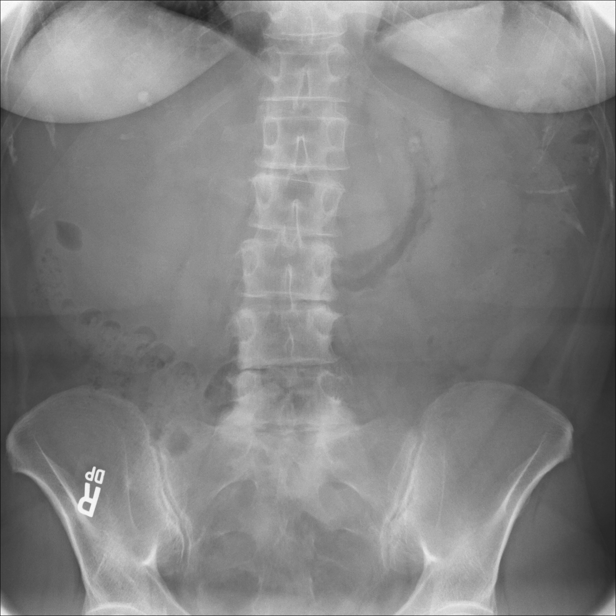

[Series 3: one shot · 2 of 7 slices shown (2 of 5)]
[im 1/7]
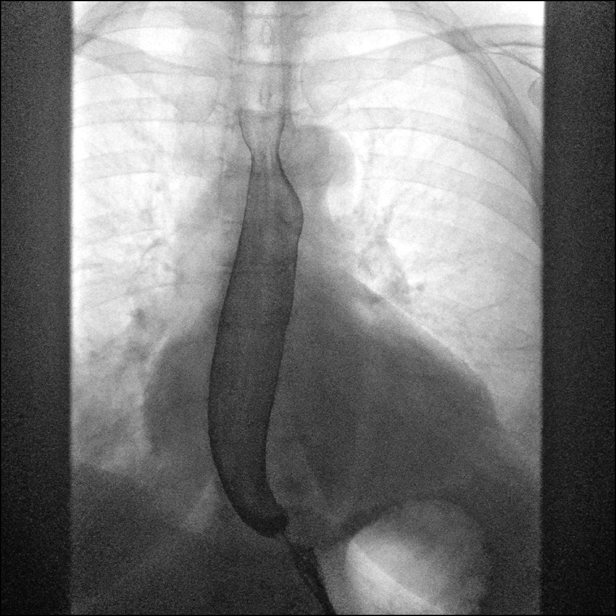
[im 7/7]
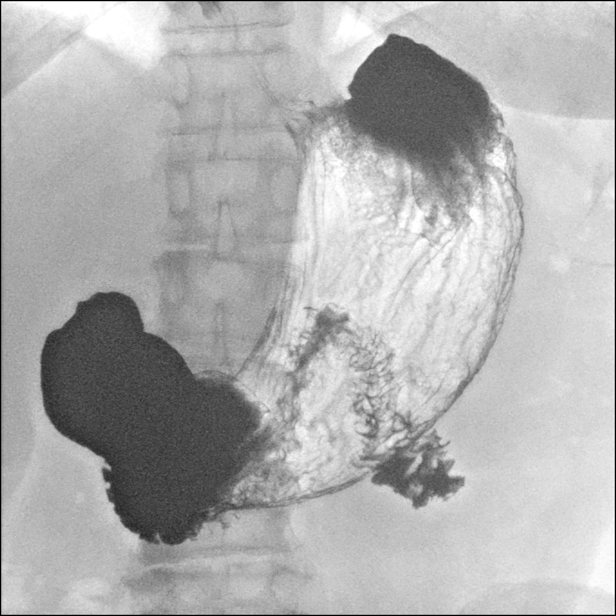

[Series 4: sequence · 1 of 20 frames shown (1 of 6)]
[frame 18/20]
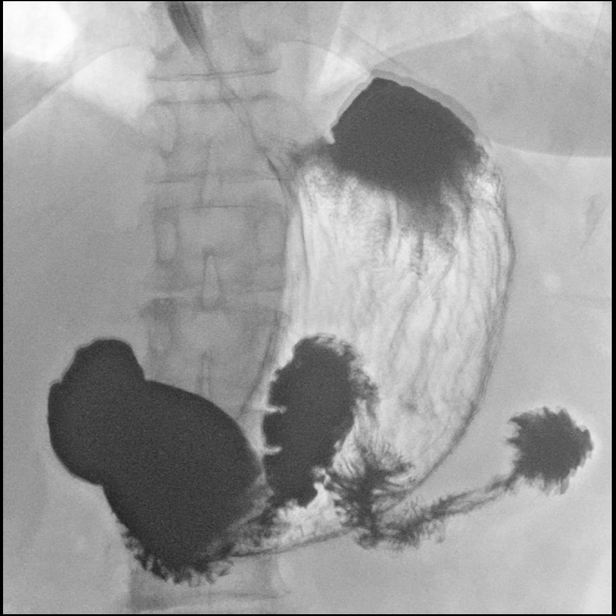

[Series 5: sequence · 1 of 3 frames shown (2 of 6)]
[frame 1/3]
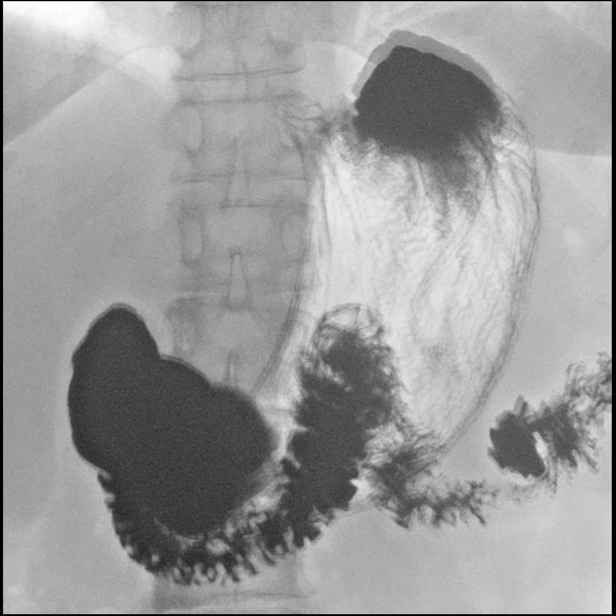

[Series 6: one shot · 2 of 11 slices shown (3 of 5)]
[im 3/11]
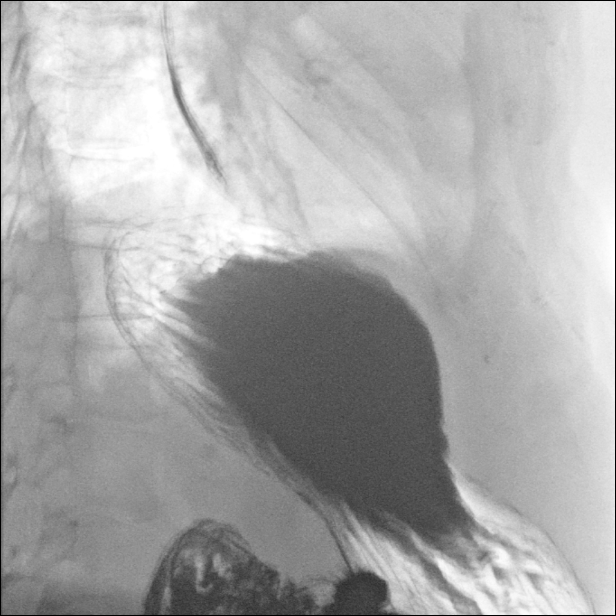
[im 11/11]
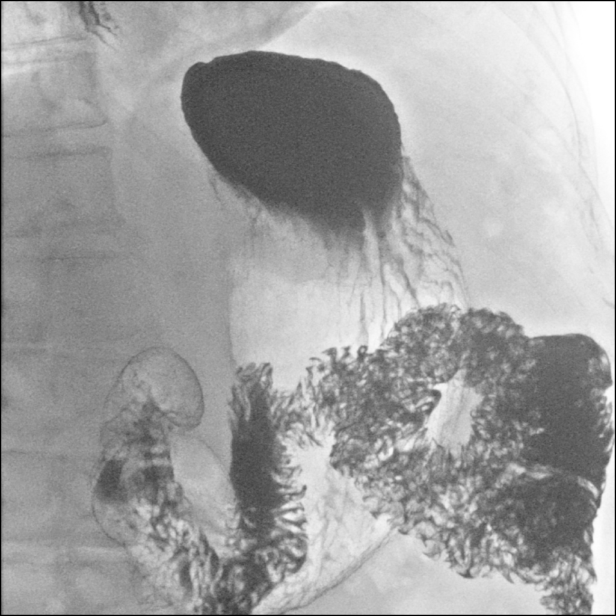

[Series 7: sequence · 1 of 16 frames shown (3 of 6)]
[frame 3/16]
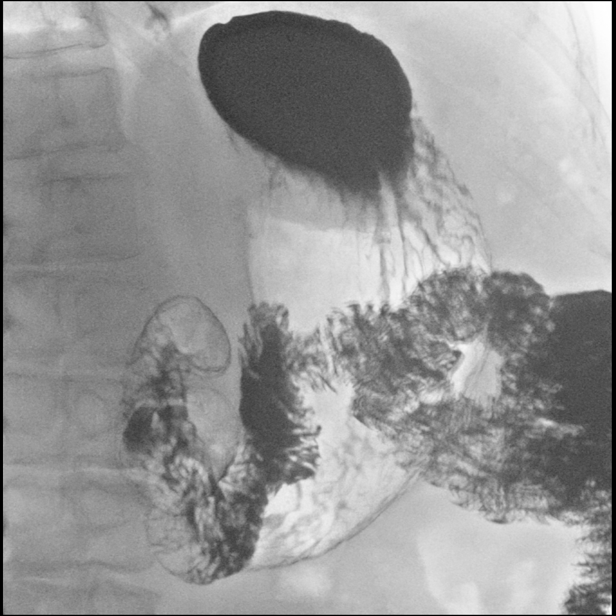

[Series 8: one shot · 1 of 4 slices shown (4 of 5)]
[im 1/4]
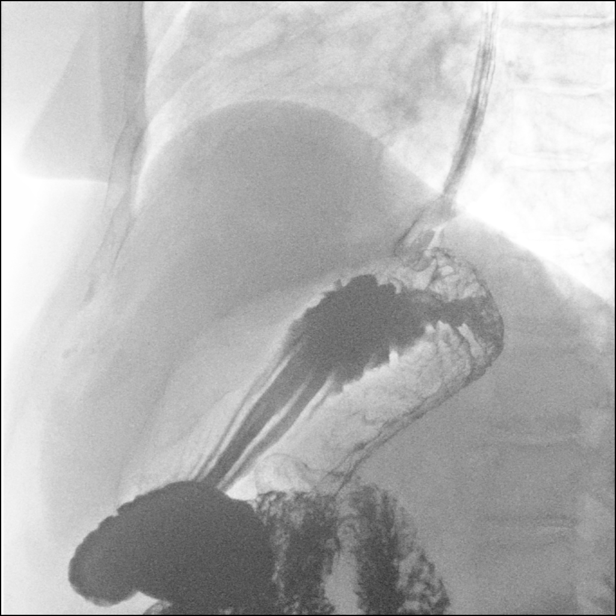

[Series 9: sequence · 1 of 14 frames shown (4 of 6)]
[frame 12/14]
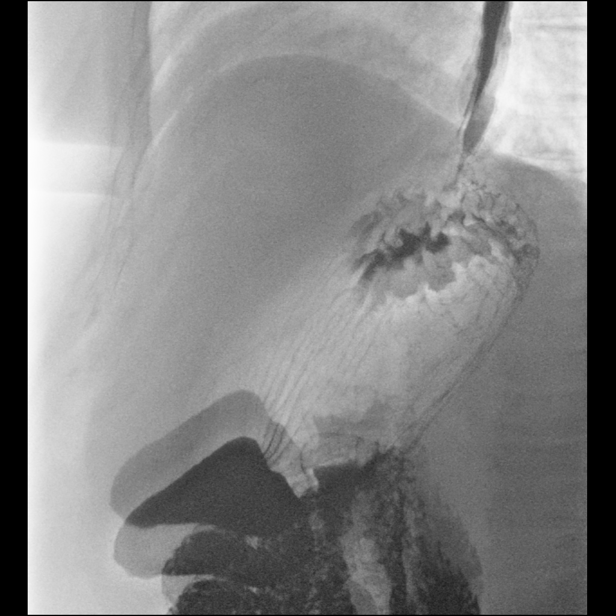

[Series 11: sequence · 2 of 32 frames shown (5 of 6)]
[frame 5/32]
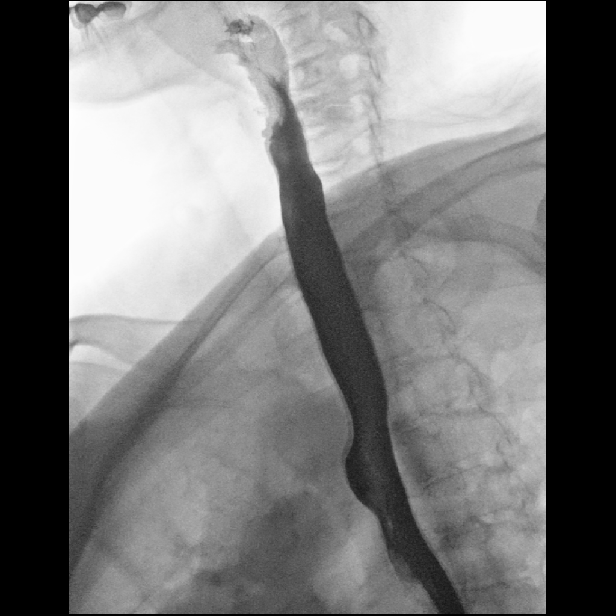
[frame 17/32]
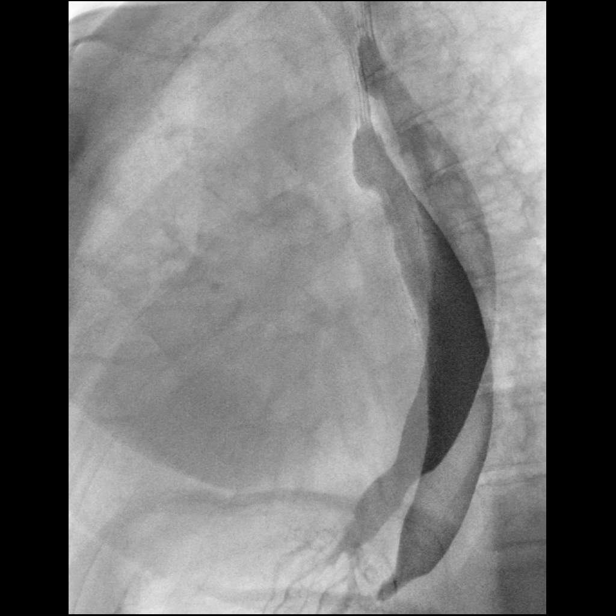

[Series 13: sequence · 1 of 12 frames shown (6 of 6)]
[frame 1/12]
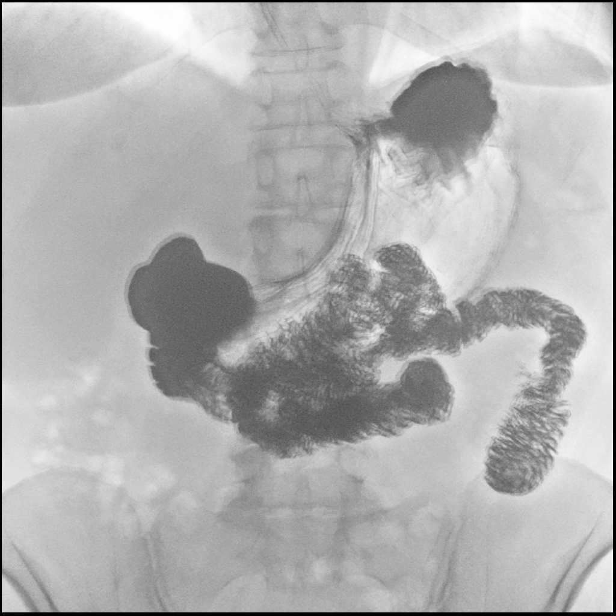

[Series 14: one shot · 1 of 1 slices shown (5 of 5)]
[im 1/1]
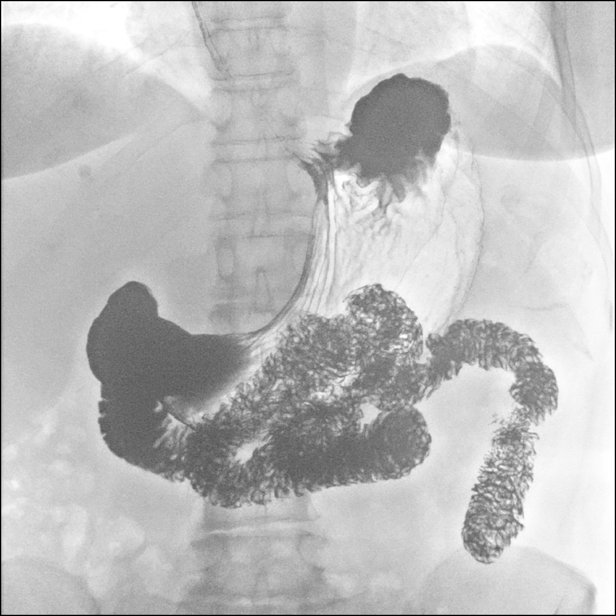

[14 of 24 positions shown; findings below may reference images not displayed]

FINDINGS: Bilateral nephrolithiasis redemonstrated on the scout view (arrows),
including a 6-7 millimeter right upper pole calculus which was
present on the prior CT. Negative visible lung bases. Normal bowel
gas pattern. Other abdominal visceral contours appear normal.
Chronic lower lumbar disc degeneration and disc space loss. No acute
osseous abnormality identified.

A double contrast study was undertaken and the patient tolerated
this well and without difficulty.

No obstruction to the forward flow of contrast throughout the
esophagus and into the stomach. Normal esophageal course and
contour. Normal esophageal mucosal pattern.

Prompt gastric emptying. Normal duodenum C-loop configuration and
ligament of Treitz position noted while the patient was still
upright.

The patient was brought supine, and after rolling twice good barium
coating of the stomach was noted.

Normal gastric contour and mucosal pattern. The gastric antrum and
duodenal bulb appear normal. Normal gastroesophageal junction.

Normal duodenum course, contour, and mucosal pattern. The visible
proximal jejunum also appears unremarkable.

When the patient was in the right lateral position spontaneous
gastroesophageal reflux occurred to the level of the thoracic inlet
(series 8), and tertiary contractions occurred (series 9).

With prone swallows esophageal motility is normal.
IMPRESSION: 1. Positive for spontaneous gastroesophageal reflux which was
observed when the patient was in the right lateral position.
2. Otherwise normal Upper GI appearance of the esophageal through
duodenum.
3. Chronic bilateral nephrolithiasis.

## 2020-02-05 ENCOUNTER — Telehealth: Payer: Self-pay

## 2020-02-05 NOTE — Telephone Encounter (Signed)
Lvm regarding flu vaccine

## 2020-04-03 ENCOUNTER — Other Ambulatory Visit: Payer: Self-pay

## 2020-04-03 ENCOUNTER — Ambulatory Visit: Payer: BC Managed Care – PPO | Admitting: Sports Medicine

## 2020-04-03 VITALS — BP 146/70 | Ht 63.0 in | Wt 170.0 lb

## 2020-04-03 DIAGNOSIS — M6788 Other specified disorders of synovium and tendon, other site: Secondary | ICD-10-CM | POA: Diagnosis not present

## 2020-04-03 MED ORDER — NITROGLYCERIN 0.2 MG/HR TD PT24: MEDICATED_PATCH | TRANSDERMAL | 1 refills | Status: DC

## 2020-04-03 NOTE — Patient Instructions (Signed)

## 2020-04-03 NOTE — Progress Notes (Signed)
  Cindee Salt - 59 y.o. female MRN [id number]  Date of birth: 11-22-1960  SUBJECTIVE:   CC: bilateral achilles pain  59 yo female presenting with bilateral heel pain for the past 4-5 months. She reports that she started to have achilles pain bilaterally when she was playing on hard courts during the winter tennis season. No acute injury. Painful with walking. She has noticed swelling over mid-achilles. Has tried stretching, icing. Has been taking ibuprofen occasionally for pain. No prior history of pain in achilles.     ROS: No instability, muscle pain, numbness/tingling, redness, otherwise see HPI   PMHx - Updated and reviewed.  Contributory factors include: Negative PSHx - Updated and reviewed.  Contributory factors include:  Negative FHx - Updated and reviewed.  Contributory factors include:  Negative Social Hx - Updated and reviewed. Contributory factors include: Negative Medications - reviewed   DATA REVIEWED: none  PHYSICAL EXAM:  VS: BP:(!) 146/70  HR: bpm  TEMP: ( )  RESP:   HT:5\' 3"  (160 cm)   WT:170 lb (77.1 kg)  BMI:30.12 PHYSICAL EXAM: Gen: NAD, alert, cooperative with exam, well-appearing HEENT: clear conjunctiva,  CV:  no edema, capillary refill brisk, normal rate Resp: non-labored Skin: no rashes, normal turgor  Neuro: no gross deficits.  Psych:  alert and oriented  Right foot: Inspection:  Pes cavus foot. Thickening of achilles ~3 cm proximal from calcaneus Palpation: TTP over achilles, maximally tender over swelling ROM: Full  ROM of the ankle. Normal midfoot flexibility Strength: 5/5 strength ankle in all planes Neurovascular: N/V intact distally in the lower extremity Special tests: Normal calcaneal motion with heel raise  Left foot: Inspection:  Pes cavus foot, collapses some with weight bearing. Thickening of achilles ~3 cm proximal from calcaneus Palpation: TTP over mid substance of achilles ROM: Full  ROM of the ankle. Normal midfoot  flexibility Strength: 5/5 strength ankle in all planes Neurovascular: N/V intact distally in the lower extremity Special tests: Normal calcaneal motion with heel raise    [place] bilateral achilles:  R achilles: Tendon intact, thickened to 0.97 cm in mid-substance of achilles ~3 cm from calcaneus. In transverse plane, hypoechoic changes consistent small intrasubstance tearing noted. Neovascularization noted over thickened region.  L achilles: Tendon intact, thickened to 1.17 cm in mid-substance of achilles. In transverse plane, hypoechoic changes consistent with small intrasubstance tearing noted as well as anachoic changes consistent with fluid. Neovascularization noted over thickened area.    ASSESSMENT & PLAN:  59 yo female presenting with bilateral achilles pain.  Ultrasound and exam findings consistent with thickened tendinopathy at midsubstance of achilles.  Provided heel lifts, eccentric achilles rehab exercises, and nitroglycerin patches. Discussed that this would take a while to heal given chronicity. Will re-evaluate in 6 weeks to assess improvement  Patient seen and evaluated with the sports medicine fellow.  I agree with the above plan of care.  Patient will follow up in 6 weeks for reevaluation.

## 2020-04-09 ENCOUNTER — Ambulatory Visit: Payer: Self-pay | Admitting: Podiatry

## 2020-05-05 ENCOUNTER — Other Ambulatory Visit: Payer: Self-pay

## 2020-05-05 MED ORDER — NITROGLYCERIN 0.2 MG/HR TD PT24: MEDICATED_PATCH | TRANSDERMAL | 1 refills | Status: DC

## 2020-05-05 NOTE — Progress Notes (Signed)
Pt called asking [place] to update the instructions on her nitro patches since she is using a 1/4 patch on each achilles daily. The pharmacy wasn't letting her get a refill.   Rx updated and sent to pharmacy.

## 2020-05-22 ENCOUNTER — Other Ambulatory Visit: Payer: Self-pay

## 2020-05-22 ENCOUNTER — Ambulatory Visit (INDEPENDENT_AMBULATORY_CARE_PROVIDER_SITE_OTHER): Payer: BC Managed Care – PPO | Admitting: Sports Medicine

## 2020-05-22 VITALS — BP 138/82 | Ht 63.0 in | Wt 170.0 lb

## 2020-05-22 DIAGNOSIS — M6788 Other specified disorders of synovium and tendon, other site: Secondary | ICD-10-CM

## 2020-05-22 NOTE — Patient Instructions (Addendum)
Thank you for coming in to see [place] today! Please see below to review our plan for today's visit:  1. We recommend you be seen and treated by Physical Therapy.   [facility]    [address] [place], [place], [place] [address]  [phone]   OR  [facility]  [address] [place], [place] [address]  [phone]   2. Discontinue the Nitroglycerin patches 3. Touch base with [place] in about 6 weeks  Please call the clinic at [phone] if your symptoms worsen or you have any concerns. It was our pleasure to serve you!   Dr. Vertis Kelch Dr. Peggyann Shoals [facility]

## 2020-05-22 NOTE — Progress Notes (Signed)
  [name] [name] - 59 y.o. female MRN [id number]  Date of birth: 16-Oct-1961    SUBJECTIVE:      Chief Complaint:/ HPI:  Pleasant patient presenting for follow up for bilateral Achilles tendon pain. Was last seen Apr 03, 2020 in the office was evaluated by ultrasound and found to have bilateral Achilles Tendinopathy.Ultrasound and exam findings consistent with thickened tendinopathy at midsubstance of achilles.She was provided heel lifts, eccentric achilles rehab exercises, and nitroglycerin patches. Discussed that this would take a while to heal given chronicity, asked to follow up for re-evaluation in 6 weeks.   Today she reports she is essentially unchanged from previous visit.  Has been performing her heel exercises twice daily, wearing heel lifts.  Has also been applying nitroglycerin patches to bilateral Achilles nodularities.  Reports that she occasionally wakes up with headaches since applying the nitroglycerin patches but that these improve with consuming caffeine (coffee every morning).  Recently came in to the office for some KT tape to see if that would make a difference in her pain, but this was ineffective at changing her pain.  Tylenol and Advil do not help to relieve her pain.  She tried playing tennis the other day but did not last long without having significant pain.  She will occasionally walk but starts having pain about 1 mile.  Has to rest to relieve the pain, but as soon as she starts walking again the pain returns.    ROS:     See HPI  PERTINENT  PMH / PSH FH / / SH:  Past Medical, Surgical, Social, and Family History Reviewed & Updated in the EMR.  Pertinent findings include:  Patient Active Problem List   Diagnosis Date Noted  . Achilles tendinosis 05/22/2020  . DJD (degenerative joint disease), lumbar 01/16/2019  . GERD (gastroesophageal reflux disease) 01/16/2019  . Trigger little finger of left hand 11/21/2017  . Nephrolithiasis 11/02/2013  . Subcutaneous cysts,  generalized 05/13/2011    OBJECTIVE: There were no vitals taken for this visit.  Physical Exam:  Vital signs are reviewed.  GEN: Alert and oriented, NAD Pulm: Breathing unlabored PSY: normal mood, congruent affect  MSK: Achilles tendons: Nodularity appreciated to bilateral Achilles tendons, each with medial and lateral tenderness to palpation; no other deformity, swelling, ecchymosis  Ultrasound findings: Diffuse swelling of Achilles tendon 2-3 cm proximal to calcaneus bilaterally indicative of Achilles tendinopathy, unchanged from prior exam  ASSESSMENT & PLAN:  1. Achilles Tendinopathy: Unchanged from previous visit -Recommend formal physical therapy -Continue wearing heel lifts to take strain off of Achilles tendons -Discontinue nitroglycerin patches -Follow-up 6 weeks to touch base   Peggyann Shoals, DO [facility], PGY-3 05/22/2020 9:19 AM   Patient seen and evaluated with the resident.  I agree with the above plan of care.  Patient will start formal physical therapy and follow-up with me in 6 weeks.  If symptoms are not improving at follow-up then consider referral to Dr. Susa Simmonds.

## 2020-06-02 DIAGNOSIS — M7662 Achilles tendinitis, left leg: Secondary | ICD-10-CM | POA: Diagnosis not present

## 2020-06-02 DIAGNOSIS — M7661 Achilles tendinitis, right leg: Secondary | ICD-10-CM | POA: Diagnosis not present

## 2020-06-04 DIAGNOSIS — M7661 Achilles tendinitis, right leg: Secondary | ICD-10-CM | POA: Diagnosis not present

## 2020-06-04 DIAGNOSIS — M7662 Achilles tendinitis, left leg: Secondary | ICD-10-CM | POA: Diagnosis not present

## 2020-06-11 DIAGNOSIS — M7662 Achilles tendinitis, left leg: Secondary | ICD-10-CM | POA: Diagnosis not present

## 2020-06-11 DIAGNOSIS — M7661 Achilles tendinitis, right leg: Secondary | ICD-10-CM | POA: Diagnosis not present

## 2020-06-13 DIAGNOSIS — M7662 Achilles tendinitis, left leg: Secondary | ICD-10-CM | POA: Diagnosis not present

## 2020-06-13 DIAGNOSIS — M7661 Achilles tendinitis, right leg: Secondary | ICD-10-CM | POA: Diagnosis not present

## 2020-06-17 DIAGNOSIS — M7662 Achilles tendinitis, left leg: Secondary | ICD-10-CM | POA: Diagnosis not present

## 2020-06-17 DIAGNOSIS — M7661 Achilles tendinitis, right leg: Secondary | ICD-10-CM | POA: Diagnosis not present

## 2020-06-20 DIAGNOSIS — M7661 Achilles tendinitis, right leg: Secondary | ICD-10-CM | POA: Diagnosis not present

## 2020-06-20 DIAGNOSIS — M7662 Achilles tendinitis, left leg: Secondary | ICD-10-CM | POA: Diagnosis not present

## 2020-06-23 DIAGNOSIS — M7662 Achilles tendinitis, left leg: Secondary | ICD-10-CM | POA: Diagnosis not present

## 2020-06-23 DIAGNOSIS — M7661 Achilles tendinitis, right leg: Secondary | ICD-10-CM | POA: Diagnosis not present

## 2020-06-26 DIAGNOSIS — M7662 Achilles tendinitis, left leg: Secondary | ICD-10-CM | POA: Diagnosis not present

## 2020-06-26 DIAGNOSIS — M7661 Achilles tendinitis, right leg: Secondary | ICD-10-CM | POA: Diagnosis not present

## 2020-07-01 DIAGNOSIS — M7662 Achilles tendinitis, left leg: Secondary | ICD-10-CM | POA: Diagnosis not present

## 2020-07-01 DIAGNOSIS — M7661 Achilles tendinitis, right leg: Secondary | ICD-10-CM | POA: Diagnosis not present

## 2020-07-08 DIAGNOSIS — M7662 Achilles tendinitis, left leg: Secondary | ICD-10-CM | POA: Diagnosis not present

## 2020-07-08 DIAGNOSIS — M7661 Achilles tendinitis, right leg: Secondary | ICD-10-CM | POA: Diagnosis not present

## 2020-07-11 DIAGNOSIS — M7661 Achilles tendinitis, right leg: Secondary | ICD-10-CM | POA: Diagnosis not present

## 2020-07-11 DIAGNOSIS — M7662 Achilles tendinitis, left leg: Secondary | ICD-10-CM | POA: Diagnosis not present

## 2020-07-23 ENCOUNTER — Encounter: Payer: 59 | Admitting: Family Medicine

## 2021-03-25 DIAGNOSIS — Z6823 Body mass index (BMI) 23.0-23.9, adult: Secondary | ICD-10-CM | POA: Diagnosis not present

## 2021-03-25 DIAGNOSIS — Z1231 Encounter for screening mammogram for malignant neoplasm of breast: Secondary | ICD-10-CM | POA: Diagnosis not present

## 2021-03-25 DIAGNOSIS — Z01419 Encounter for gynecological examination (general) (routine) without abnormal findings: Secondary | ICD-10-CM | POA: Diagnosis not present

## 2021-04-01 DIAGNOSIS — Z13220 Encounter for screening for lipoid disorders: Secondary | ICD-10-CM | POA: Diagnosis not present

## 2021-04-01 DIAGNOSIS — Z13228 Encounter for screening for other metabolic disorders: Secondary | ICD-10-CM | POA: Diagnosis not present

## 2021-05-22 DIAGNOSIS — U071 COVID-19: Secondary | ICD-10-CM | POA: Diagnosis not present

## 2021-08-19 DIAGNOSIS — H25013 Cortical age-related cataract, bilateral: Secondary | ICD-10-CM | POA: Diagnosis not present

## 2021-08-19 DIAGNOSIS — H2513 Age-related nuclear cataract, bilateral: Secondary | ICD-10-CM | POA: Diagnosis not present

## 2021-08-19 DIAGNOSIS — G43B0 Ophthalmoplegic migraine, not intractable: Secondary | ICD-10-CM | POA: Diagnosis not present

## 2021-08-19 DIAGNOSIS — H524 Presbyopia: Secondary | ICD-10-CM | POA: Diagnosis not present

## 2021-09-25 DIAGNOSIS — N200 Calculus of kidney: Secondary | ICD-10-CM | POA: Diagnosis not present

## 2022-01-29 ENCOUNTER — Other Ambulatory Visit (HOSPITAL_BASED_OUTPATIENT_CLINIC_OR_DEPARTMENT_OTHER): Payer: Self-pay

## 2022-01-29 ENCOUNTER — Encounter (HOSPITAL_COMMUNITY): Payer: Self-pay

## 2022-01-29 ENCOUNTER — Ambulatory Visit (HOSPITAL_COMMUNITY)
Admission: RE | Admit: 2022-01-29 | Discharge: 2022-01-29 | Disposition: A | Discharge status: Home / Self Care | Payer: 59 | Source: Ambulatory Visit | Attending: Emergency Medicine | Admitting: Emergency Medicine

## 2022-01-29 ENCOUNTER — Other Ambulatory Visit: Payer: Self-pay

## 2022-01-29 VITALS — BP 105/62 | HR 89 | Temp 98.9°F | Resp 16 | Ht 63.0 in | Wt 170.0 lb

## 2022-01-29 DIAGNOSIS — M545 Low back pain, unspecified: Secondary | ICD-10-CM | POA: Diagnosis not present

## 2022-01-29 LAB — POCT URINALYSIS DIPSTICK, ED / UC
Bilirubin Urine: NEGATIVE
Glucose, UA: NEGATIVE mg/dL
Ketones, ur: NEGATIVE mg/dL
Leukocytes,Ua: NEGATIVE
Nitrite: NEGATIVE
Protein, ur: NEGATIVE mg/dL
Specific Gravity, Urine: <=1.005 (ref 1.005–1.030)
Urobilinogen, UA: 0.2 mg/dL (ref 0.0–1.0)
pH: 6.5 (ref 5.0–8.0)

## 2022-01-29 MED ORDER — PREDNISONE 20 MG PO TABS
40.0000 mg | ORAL_TABLET | Freq: Every day | ORAL | 0 refills | Status: DC
  Filled 2022-01-29: qty 10, 5d supply, fill #0

## 2022-01-29 MED ORDER — BACLOFEN 10 MG PO TABS
10.0000 mg | ORAL_TABLET | Freq: Every day | ORAL | 0 refills | Status: DC
  Filled 2022-01-29: qty 30, 30d supply, fill #0

## 2022-01-29 NOTE — ED Triage Notes (Signed)
Pt reports lower back pain x 9 days. Pt also states she noticed dark urine a few times.  ?

## 2022-01-29 NOTE — Discharge Instructions (Addendum)
Your pain is most likely caused by irritation to the muscles or ligaments.  ? ?Starting tomorrow take prednisone every morning with food for the next 5 days ? ?You may use muscle relaxer for additional comfort at bedtime ? ?You may use heating pad in 15 minute intervals as needed for additional comfort, or  2-3 days you may find comfort in using ice in 10-15 minutes over affected area ? ?Begin stretching affected area daily for 10 minutes as tolerated to further loosen muscles  ? ?When lying down place pillow underneath and between knees for support ? ?Can try sleeping without pillow on firm mattress  ? ?Practice good posture: head back, shoulders back, chest forward, pelvis back and weight distributed evenly on both legs ? ?If pain persist after recommended treatment or reoccurs if may be beneficial to follow up with orthopedic specialist for evaluation, this doctor specializes in the bones and can manage your symptoms long-term with options such as but not limited to imaging, medications or physical therapy  ?

## 2022-01-29 NOTE — ED Provider Notes (Signed)
?[place] ? ? ? ?CSN: [id number] ?Arrival date & time: 01/29/22  1437 ? ? ?  ? ?History   ?Chief Complaint ?Chief Complaint  ?Patient presents with  ? Back Pain  ? ? ?HPI ?[name] [name] is a 61 y.o. female.  ? ?Patient presents with centralized lower back pain for 9 days.  Described as a burning sensation and discomfort.  Worsened by long periods of sitting and arching of the back.  Denies numbness or tingling, prior injury or trauma.  Has not attempted treatment of symptoms.  Endorses possible associated darkening urine which has occurred intermittently over the last 9 days.  Denies urinary frequency, urgency, dysuria, hematuria, flank pain or lower abdominal pressure. ? ?Past Medical History:  ?Diagnosis Date  ? GERD (gastroesophageal reflux disease)   ? History of kidney stones   ? SVD (spontaneous vaginal delivery)   ? x 2  ? ? ?Patient Active Problem List  ? Diagnosis Date Noted  ? Achilles tendinosis 05/22/2020  ? DJD (degenerative joint disease), lumbar 01/16/2019  ? GERD (gastroesophageal reflux disease) 01/16/2019  ? Trigger little finger of left hand 11/21/2017  ? Nephrolithiasis 11/02/2013  ? Subcutaneous cysts, generalized 05/13/2011  ? ? ?Past Surgical History:  ?Procedure Laterality Date  ? ANTERIOR AND POSTERIOR REPAIR WITH SACROSPINOUS FIXATION N/A 09/19/2014  ? Procedure: ANTERIOR AND POSTERIOR REPAIR WITH SACROSPINOUS FIXATION;  Surgeon: Margarette Asal, MD;  Location: [facility];  Service: Gynecology;  Laterality: N/A;  ? COLONOSCOPY    ? DILATION AND CURETTAGE OF UTERUS    ? LAPAROSCOPY N/A 09/19/2014  ? Procedure: FAILED LAPAROSCOPY DIAGNOSTIC;  Surgeon: Margarette Asal, MD;  Location: [facility];  Service: Gynecology;  Laterality: N/A;  ? lithotripsy    ? kidney stone  ? SPINE SURGERY    ? disc fragment removed  ? TUBAL LIGATION  1993  ? VAGINAL HYSTERECTOMY N/A 09/19/2014  ? Procedure: HYSTERECTOMY VAGINAL;  Surgeon: Margarette Asal, MD;  Location: [facility];  Service: Gynecology;   Laterality: N/A;  ? WISDOM TOOTH EXTRACTION    ? ? ?OB History   ?No obstetric history on file. ?  ? ? ? ?Home Medications   ? ?Prior to Admission medications   ?Medication Sig Start Date End Date Taking? Authorizing Provider  ?baclofen (LIORESAL) 10 MG tablet Take 1 tablet (10 mg total) by mouth at bedtime. 01/29/22  Yes Zackry Deines, Leitha Schuller, NP  ?predniSONE (DELTASONE) 20 MG tablet Take 2 tablets (40 mg total) by mouth daily. 01/29/22  Yes Hans Eden, NP  ?Glucosamine HCl (GLUCOSAMINE PO) Take 4,000 mg by mouth daily.    [provider]  ?hydrOXYzine (VISTARIL) 25 MG capsule Take 1 capsule (25 mg total) by mouth 3 (three) times daily as needed for itching. 04/27/19   Leamon Arnt, MD  ?Multiple Vitamin (MULTIVITAMIN) tablet Take 1 tablet by mouth daily.    [provider]  ?nitroGLYCERIN (NITRODUR - DOSED IN MG/24 HR) 0.2 mg/hr patch Place 1/4 patch on both achilles tendons. Change daily. 05/05/20   Thurman Coyer, DO  ?omeprazole (PRILOSEC) 20 MG capsule Take 20 mg by mouth daily.    [provider]  ? ? ?Family History ?Family History  ?Problem Relation Age of Onset  ? Hypertension Mother   ? Ovarian cancer Mother   ? High Cholesterol Mother   ? Healthy Sister   ? Drug abuse Son   ?     heroine  ? Alcohol abuse Maternal Grandmother   ?  Alcohol abuse Maternal Grandfather   ? Cancer Paternal Grandmother   ? Heart attack Paternal Grandfather   ? Alcohol abuse Paternal Grandfather   ? ? ?Social History ?Social History  ? ?Tobacco Use  ? Smoking status: Former  ?  Packs/day: 1.00  ?  Years: 30.00  ?  Pack years: 30.00  ?  Types: Cigarettes  ?  Quit date: 04/15/2014  ?  Years since quitting: 7.7  ? Smokeless tobacco: Never  ?Substance Use Topics  ? Alcohol use: Yes  ?  Comment: socially  ? Drug use: No  ? ? ? ?Allergies   ?Sulfa antibiotics ? ? ?Review of Systems ?Review of Systems ?Defer to HPI  ? ? ?Physical Exam ?Triage Vital Signs ?ED Triage Vitals  ?Enc Vitals Group  ?   BP 01/29/22  1501 105/62  ?   Pulse Rate 01/29/22 1501 89  ?   Resp 01/29/22 1501 16  ?   Temp 01/29/22 1501 98.9 ?F (37.2 ?C)  ?   Temp Source 01/29/22 1501 Oral  ?   SpO2 01/29/22 1501 99 %  ?   Weight 01/29/22 1500 169 lb 15.6 oz (77.1 kg)  ?   Height 01/29/22 1500 5\' 3"  (1.6 m)  ?   Head Circumference --   ?   Peak Flow --   ?   Pain Score 01/29/22 1500 6  ?   Pain Loc --   ?   Pain Edu? --   ?   Excl. in [place]? --   ? ?No data found. ? ?Updated Vital Signs ?BP 105/62 (BP Location: Right Arm)   Pulse 89   Temp 98.9 ?F (37.2 ?C) (Oral)   Resp 16   Ht 5\' 3"  (1.6 m)   Wt 169 lb 15.6 oz (77.1 kg)   SpO2 99%   BMI 30.11 kg/m?  ? ?Visual Acuity ?Right Eye Distance:   ?Left Eye Distance:   ?Bilateral Distance:   ? ?Right Eye Near:   ?Left Eye Near:    ?Bilateral Near:    ? ?Physical Exam ?Constitutional:   ?   Appearance: Normal appearance.  ?Eyes:  ?   Extraocular Movements: Extraocular movements intact.  ?Pulmonary:  ?   Effort: Pulmonary effort is normal.  ?Abdominal:  ?   General: Abdomen is flat. Bowel sounds are normal. There is no distension.  ?   Palpations: Abdomen is soft.  ?   Tenderness: There is no abdominal tenderness. There is no right CVA tenderness or left CVA tenderness.  ?Musculoskeletal:  ?   Comments: Unable To reproduce tenderness on exam, range of motion intact  ?Neurological:  ?   Mental Status: She is alert and oriented to person, place, and time. Mental status is at baseline.  ?Psychiatric:     ?   Mood and Affect: Mood normal.     ?   Behavior: Behavior normal.  ? ? ? ?UC Treatments / Results  ?Labs ?(all labs ordered are listed, but only abnormal results are displayed) ?Labs Reviewed  ?POCT URINALYSIS DIPSTICK, ED / UC - Abnormal; Notable for the following components:  ?    Result Value  ? Hgb urine dipstick TRACE (*)   ? All other components within normal limits  ? ? ?EKG ? ? ?Radiology ?No results found. ? ?Procedures ?Procedures (including critical care time) ? ?Medications Ordered in  UC ?Medications - No data to display ? ?Initial Impression / Assessment and Plan / UC Course  ?I have  reviewed the triage vital signs and the nursing notes. ? ?Pertinent labs & imaging results that were available during my care of the patient were reviewed by me and considered in my medical decision making (see chart for details). ? ?Acute midline low back pain without sciatica ? ?Urinalysis negative, discussed findings with the patient, will treat for muscular pain at this time, prednisone burst and baclofen sent to her pharmacy for management, recommended RICE, heat in 15-minute intervals, pillows for support and activity as tolerated, may follow-up with her orthopedic specialist for persisting symptoms ?Final Clinical Impressions(s) / UC Diagnoses  ? ?Final diagnoses:  ?Acute midline low back pain without sciatica  ? ? ? ?Discharge Instructions   ? ?  ?Your pain is most likely caused by irritation to the muscles or ligaments.  ? ?Starting tomorrow take prednisone every morning with food for the next 5 days ? ?You may use muscle relaxer for additional comfort at bedtime ? ?You may use heating pad in 15 minute intervals as needed for additional comfort, or  2-3 days you may find comfort in using ice in 10-15 minutes over affected area ? ?Begin stretching affected area daily for 10 minutes as tolerated to further loosen muscles  ? ?When lying down place pillow underneath and between knees for support ? ?Can try sleeping without pillow on firm mattress  ? ?Practice good posture: head back, shoulders back, chest forward, pelvis back and weight distributed evenly on both legs ? ?If pain persist after recommended treatment or reoccurs if may be beneficial to follow up with orthopedic specialist for evaluation, this doctor specializes in the bones and can manage your symptoms long-term with options such as but not limited to imaging, medications or physical therapy  ? ? ?ED Prescriptions   ? ? Medication Sig Dispense  Auth. Provider  ? predniSONE (DELTASONE) 20 MG tablet Take 2 tablets (40 mg total) by mouth daily. 10 tablet Lowella Petties R, NP  ? baclofen (LIORESAL) 10 MG tablet Take 1 tablet (10 mg total) by mouth at bedtime. 66 each W

## 2022-03-03 ENCOUNTER — Encounter (HOSPITAL_BASED_OUTPATIENT_CLINIC_OR_DEPARTMENT_OTHER): Payer: Self-pay | Admitting: Family Medicine

## 2022-03-03 ENCOUNTER — Ambulatory Visit (INDEPENDENT_AMBULATORY_CARE_PROVIDER_SITE_OTHER): Payer: 59 | Admitting: Family Medicine

## 2022-03-03 DIAGNOSIS — K219 Gastro-esophageal reflux disease without esophagitis: Secondary | ICD-10-CM

## 2022-03-03 DIAGNOSIS — Z0000 Encounter for general adult medical examination without abnormal findings: Secondary | ICD-10-CM

## 2022-03-03 NOTE — Progress Notes (Signed)
? ?New Patient Office Visit ? ?Subjective   ? ?Patient ID: [name] [name], female    DOB: 1961-07-10  Age: 61 y.o. MRN: [id number] ? ?CC: Establish care ? ?HPI ?[name] [name] presents to establish care.  Does not have any specific concerns today ?Last PCP was through [facility] health, last saw them about 3 years ago.  She does also follow with an OB/GYN locally for monitoring of women's health issues. ? ?GERD: Reports history of reflux, symptoms are controlled with use of omeprazole.  Does not have any specific concerns today regarding this ? ?Patient is originally from [place].  She moved in about 1976, has lived in areas around [place] and [place], has been living in [place] since 2001.  She is not currently working.  She does enjoy playing tennis, pickleball, works out at [employer] ? ?Outpatient Encounter Medications as of 03/03/2022  ?Medication Sig  ? baclofen (LIORESAL) 10 MG tablet Take 1 tablet (10 mg total) by mouth at bedtime. (Patient not taking: Reported on 03/03/2022)  ? Glucosamine HCl (GLUCOSAMINE PO) Take 4,000 mg by mouth daily. (Patient not taking: Reported on 03/03/2022)  ? hydrOXYzine (VISTARIL) 25 MG capsule Take 1 capsule (25 mg total) by mouth 3 (three) times daily as needed for itching.  ? Multiple Vitamin (MULTIVITAMIN) tablet Take 1 tablet by mouth daily. (Patient not taking: Reported on 03/03/2022)  ? nitroGLYCERIN (NITRODUR - DOSED IN MG/24 HR) 0.2 mg/hr patch Place 1/4 patch on both achilles tendons. Change daily.  ? omeprazole (PRILOSEC) 20 MG capsule Take 20 mg by mouth daily.  ? predniSONE (DELTASONE) 20 MG tablet Take 2 tablets (40 mg total) by mouth daily.  ? ?No facility-administered encounter medications on file as of 03/03/2022.  ? ? ?Past Medical History:  ?Diagnosis Date  ? GERD (gastroesophageal reflux disease)   ? History of kidney stones   ? SVD (spontaneous vaginal delivery)   ? x 2  ? ? ?Past Surgical History:  ?Procedure Laterality Date  ? ANTERIOR  AND POSTERIOR REPAIR WITH SACROSPINOUS FIXATION N/A 09/19/2014  ? Procedure: ANTERIOR AND POSTERIOR REPAIR WITH SACROSPINOUS FIXATION;  Surgeon: Meriel Pica, MD;  Location: [facility];  Service: Gynecology;  Laterality: N/A;  ? COLONOSCOPY    ? DILATION AND CURETTAGE OF UTERUS    ? LAPAROSCOPY N/A 09/19/2014  ? Procedure: FAILED LAPAROSCOPY DIAGNOSTIC;  Surgeon: Meriel Pica, MD;  Location: [facility];  Service: Gynecology;  Laterality: N/A;  ? lithotripsy    ? kidney stone  ? SPINE SURGERY    ? disc fragment removed  ? TUBAL LIGATION  1993  ? VAGINAL HYSTERECTOMY N/A 09/19/2014  ? Procedure: HYSTERECTOMY VAGINAL;  Surgeon: Meriel Pica, MD;  Location: [facility];  Service: Gynecology;  Laterality: N/A;  ? WISDOM TOOTH EXTRACTION    ? ? ?Family History  ?Problem Relation Age of Onset  ? Hypertension Mother   ? Ovarian cancer Mother   ? High Cholesterol Mother   ? Healthy Sister   ? Drug abuse Son   ?     heroine  ? Alcohol abuse Maternal Grandmother   ? Alcohol abuse Maternal Grandfather   ? Cancer Paternal Grandmother   ? Heart attack Paternal Grandfather   ? Alcohol abuse Paternal Grandfather   ? ? ?Social History  ? ?Socioeconomic History  ? Marital status: Married  ?  Spouse name: Not on file  ? Number of children: 2  ? Years of education: Not on file  ?  Highest education level: Not on file  ?Occupational History  ? Occupation: homemaker  ?Tobacco Use  ? Smoking status: Former  ?  Packs/day: 1.00  ?  Years: 30.00  ?  Pack years: 30.00  ?  Types: Cigarettes  ?  Quit date: 04/15/2014  ?  Years since quitting: 7.8  ? Smokeless tobacco: Never  ?Substance and Sexual Activity  ? Alcohol use: Yes  ?  Comment: socially  ? Drug use: No  ? Sexual activity: Yes  ?  Birth control/protection: Surgical  ?Other Topics Concern  ? Not on file  ?Social History Narrative  ? Not on file  ? ?Social Determinants of Health  ? ?Financial Resource Strain: Not on file  ?Food Insecurity: Not on file  ?Transportation Needs: Not on file   ?Physical Activity: Not on file  ?Stress: Not on file  ?Social Connections: Not on file  ?Intimate Partner Violence: Not on file  ? ? ?Objective   ? ?BP (!) 107/57   Pulse 86   Temp 98.7 ?F (37.1 ?C)   Ht 5\' 3"  (1.6 m)   Wt 135 lb (61.2 kg)   SpO2 97%   BMI 23.91 kg/m?  ? ?Physical Exam ? ?61 year old female in no acute distress ?Cardiovascular exam with regular rate and rhythm, no murmur appreciated ?Lungs clear to auscultation bilaterally ? ?Assessment & Plan:  ? ?Problem List Items Addressed This Visit   ? ?  ? Digestive  ? GERD (gastroesophageal reflux disease)  ?  Symptoms well controlled at present with use of PPI, can continue with this, recommend taking medication first in the morning at least 30 minutes before first meal for optimal impact ? ?  ?  ? ?Other Visit Diagnoses   ? ? Wellness examination      ? Relevant Orders  ? CBC with Differential/Platelet  ? Comprehensive metabolic panel  ? Hemoglobin A1c  ? Lipid panel  ? TSH Rfx on Abnormal to Free T4  ? ?  ? ? ?Return in about 6 weeks (around 04/14/2022).  For CPE, nurse visit for labs about 1 week before ? ?Keelyn Fjelstad J De 04/16/2022, MD ? ?

## 2022-03-03 NOTE — Patient Instructions (Signed)

## 2022-03-03 NOTE — Assessment & Plan Note (Signed)
Symptoms well controlled at present with use of PPI, can continue with this, recommend taking medication first in the morning at least 30 minutes before first meal for optimal impact ?

## 2022-04-07 DIAGNOSIS — Z6823 Body mass index (BMI) 23.0-23.9, adult: Secondary | ICD-10-CM | POA: Diagnosis not present

## 2022-04-07 DIAGNOSIS — Z1231 Encounter for screening mammogram for malignant neoplasm of breast: Secondary | ICD-10-CM | POA: Diagnosis not present

## 2022-04-07 DIAGNOSIS — Z01419 Encounter for gynecological examination (general) (routine) without abnormal findings: Secondary | ICD-10-CM | POA: Diagnosis not present

## 2022-04-29 ENCOUNTER — Ambulatory Visit (HOSPITAL_BASED_OUTPATIENT_CLINIC_OR_DEPARTMENT_OTHER): Payer: 59

## 2022-04-29 ENCOUNTER — Other Ambulatory Visit (HOSPITAL_BASED_OUTPATIENT_CLINIC_OR_DEPARTMENT_OTHER): Payer: Self-pay

## 2022-04-29 DIAGNOSIS — Z0000 Encounter for general adult medical examination without abnormal findings: Secondary | ICD-10-CM | POA: Diagnosis not present

## 2022-04-30 LAB — COMPREHENSIVE METABOLIC PANEL
ALT: 21 IU/L (ref 0–32)
AST: 35 IU/L (ref 0–40)
Albumin/Globulin Ratio: 2.1 (ref 1.2–2.2)
Albumin: 3.7 g/dL — ABNORMAL LOW (ref 3.8–4.8)
Alkaline Phosphatase: 83 IU/L (ref 44–121)
BUN/Creatinine Ratio: 19 (ref 12–28)
BUN: 14 mg/dL (ref 8–27)
Bilirubin Total: 0.2 mg/dL (ref 0.0–1.2)
CO2: 22 mmol/L (ref 20–29)
Calcium: 8.8 mg/dL (ref 8.7–10.3)
Chloride: 107 mmol/L — ABNORMAL HIGH (ref 96–106)
Creatinine, Ser: 0.73 mg/dL (ref 0.57–1.00)
Globulin, Total: 1.8 g/dL (ref 1.5–4.5)
Glucose: 90 mg/dL (ref 70–99)
Potassium: 4.7 mmol/L (ref 3.5–5.2)
Sodium: 143 mmol/L (ref 134–144)
Total Protein: 5.5 g/dL — ABNORMAL LOW (ref 6.0–8.5)
eGFR: 94 mL/min/{1.73_m2} (ref >59)

## 2022-04-30 LAB — CBC WITH DIFFERENTIAL/PLATELET
Basophils Absolute: 0.1 10*3/uL (ref 0.0–0.2)
Basos: 1 %
EOS (ABSOLUTE): 0.4 10*3/uL (ref 0.0–0.4)
Eos: 11 %
Hematocrit: 37.3 % (ref 34.0–46.6)
Hemoglobin: 11.1 g/dL (ref 11.1–15.9)
Immature Grans (Abs): 0 10*3/uL (ref 0.0–0.1)
Immature Granulocytes: 0 %
Lymphocytes Absolute: 1.3 10*3/uL (ref 0.7–3.1)
Lymphs: 33 %
MCH: 24.9 pg — ABNORMAL LOW (ref 26.6–33.0)
MCHC: 29.8 g/dL — ABNORMAL LOW (ref 31.5–35.7)
MCV: 84 fL (ref 79–97)
Monocytes Absolute: 0.5 10*3/uL (ref 0.1–0.9)
Monocytes: 12 %
Neutrophils Absolute: 1.7 10*3/uL (ref 1.4–7.0)
Neutrophils: 43 %
Platelets: 356 10*3/uL (ref 150–450)
RBC: 4.45 x10E6/uL (ref 3.77–5.28)
RDW: 13.8 % (ref 11.7–15.4)
WBC: 3.9 10*3/uL (ref 3.4–10.8)

## 2022-04-30 LAB — LIPID PANEL
Chol/HDL Ratio: 2.4 ratio (ref 0.0–4.4)
Cholesterol, Total: 202 mg/dL — ABNORMAL HIGH (ref 100–199)
HDL: 85 mg/dL (ref >39)
LDL Chol Calc (NIH): 109 mg/dL — ABNORMAL HIGH (ref 0–99)
Triglycerides: 45 mg/dL (ref 0–149)
VLDL Cholesterol Cal: 8 mg/dL (ref 5–40)

## 2022-04-30 LAB — HEMOGLOBIN A1C
Est. average glucose Bld gHb Est-mCnc: 111 mg/dL
Hgb A1c MFr Bld: 5.5 % (ref 4.8–5.6)

## 2022-04-30 LAB — TSH RFX ON ABNORMAL TO FREE T4: TSH: 2.81 u[IU]/mL (ref 0.450–4.500)

## 2022-05-03 ENCOUNTER — Ambulatory Visit (INDEPENDENT_AMBULATORY_CARE_PROVIDER_SITE_OTHER): Payer: 59 | Admitting: Family Medicine

## 2022-05-03 ENCOUNTER — Encounter (HOSPITAL_BASED_OUTPATIENT_CLINIC_OR_DEPARTMENT_OTHER): Payer: Self-pay | Admitting: Family Medicine

## 2022-05-03 DIAGNOSIS — Z0000 Encounter for general adult medical examination without abnormal findings: Secondary | ICD-10-CM

## 2022-05-03 DIAGNOSIS — M79641 Pain in right hand: Secondary | ICD-10-CM | POA: Insufficient documentation

## 2022-05-03 NOTE — Assessment & Plan Note (Signed)
Patient has been having some ongoing right hand pain, likely at MCP of right index finger.  She is requesting referral to orthopedic surgery today for further evaluation, feel that this is reasonable, referral placed today

## 2022-05-03 NOTE — Assessment & Plan Note (Addendum)
Routine HCM labs reviewed. HCM reviewed/discussed. Anticipatory guidance regarding healthy weight, lifestyle and choices given. Recommend healthy diet.  Recommend approximately 150 minutes/week of moderate intensity exercise Recommend regular dental and vision exams Always use seatbelt/lap and shoulder restraints Recommend using smoke alarms and checking batteries at least twice a year Recommend using sunscreen when outside Patient is up-to-date with tetanus.  She also reports that she completed her mammogram through her OB/GYN recently

## 2022-05-03 NOTE — Patient Instructions (Signed)
  Medication Instructions:  Your physician recommends that you continue on your current medications as directed. Please refer to the Current Medication list given to you today. --If you need a refill on any your medications before your next appointment, please call your pharmacy first. If no refills are authorized on file call the office.-- Lab Work: Your physician has recommended that you have lab work today: No If you have labs (blood work) drawn today and your tests are completely normal, you will receive your results via MyChart message OR a phone call from our staff.  Please ensure you check your voicemail in the event that you authorized detailed messages to be left on a delegated number. If you have any lab test that is abnormal or we need to change your treatment, we will call you to review the results.  Referrals/Procedures/Imaging: Orthopedics  Follow-Up: Your next appointment:   Your physician recommends that you schedule a follow-up appointment in 1 yr cpe with Dr. Tommi Rumps [place].  You will receive a text message or e-mail with a link to a survey about your care and experience with [place] today! We would greatly appreciate your feedback!   Thanks for letting us be apart of your health journey!!  Primary Care and Sports Medicine   Dr. Ceasar Mons [place]   We encourage you to activate your patient portal called "MyChart".  Sign up information is provided on this After Visit Summary.  MyChart is used to connect with patients for Virtual Visits (Telemedicine).  Patients are able to view lab/test results, encounter notes, upcoming appointments, etc.  Non-urgent messages can be sent to your provider as well. To learn more about what you can do with MyChart, please visit --  [email or web address].

## 2022-05-03 NOTE — Progress Notes (Signed)
Subjective:    CC: Annual Physical Exam  HPI:  [name] [name] is a 61 y.o. presenting for annual physical  I reviewed the past medical history, family history, social history, surgical history, and allergies today and no changes were needed.  Please see the problem list section below in epic for further details.  Past Medical History: Past Medical History:  Diagnosis Date   GERD (gastroesophageal reflux disease)    History of kidney stones    SVD (spontaneous vaginal delivery)    x 2   Past Surgical History: Past Surgical History:  Procedure Laterality Date   ANTERIOR AND POSTERIOR REPAIR WITH SACROSPINOUS FIXATION N/A 09/19/2014   Procedure: ANTERIOR AND POSTERIOR REPAIR WITH SACROSPINOUS FIXATION;  Surgeon: Meriel Pica, MD;  Location: [facility];  Service: Gynecology;  Laterality: N/A;   COLONOSCOPY     DILATION AND CURETTAGE OF UTERUS     LAPAROSCOPY N/A 09/19/2014   Procedure: FAILED LAPAROSCOPY DIAGNOSTIC;  Surgeon: Meriel Pica, MD;  Location: [facility];  Service: Gynecology;  Laterality: N/A;   lithotripsy     kidney stone   SPINE SURGERY     disc fragment removed   TUBAL LIGATION  1993   VAGINAL HYSTERECTOMY N/A 09/19/2014   Procedure: HYSTERECTOMY VAGINAL;  Surgeon: Meriel Pica, MD;  Location: [facility];  Service: Gynecology;  Laterality: N/A;   WISDOM TOOTH EXTRACTION     Social History: Social History   Socioeconomic History   Marital status: Married    Spouse name: Not on file   Number of children: 2   Years of education: Not on file   Highest education level: Not on file  Occupational History   Occupation: homemaker  Tobacco Use   Smoking status: Former    Packs/day: 1.00    Years: 30.00    Total pack years: 30.00    Types: Cigarettes    Quit date: 04/15/2014    Years since quitting: 8.0   Smokeless tobacco: Never  Substance and Sexual Activity   Alcohol use: Yes    Comment: socially   Drug use: No   Sexual activity: Yes    Birth  control/protection: Surgical  Other Topics Concern   Not on file  Social History Narrative   Not on file   Social Determinants of Health   Financial Resource Strain: Not on file  Food Insecurity: Not on file  Transportation Needs: Not on file  Physical Activity: Not on file  Stress: Not on file  Social Connections: Not on file   Family History: Family History  Problem Relation Age of Onset   Hypertension Mother    Ovarian cancer Mother    High Cholesterol Mother    Healthy Sister    Drug abuse Son        heroine   Alcohol abuse Maternal Grandmother    Alcohol abuse Maternal Grandfather    Cancer Paternal Grandmother    Heart attack Paternal Grandfather    Alcohol abuse Paternal Grandfather    Allergies: Allergies  Allergen Reactions   Sulfa Antibiotics Rash   Medications: See med rec.  Review of Systems: No headache, visual changes, nausea, vomiting, diarrhea, constipation, dizziness, abdominal pain, skin rash, fevers, chills, night sweats, swollen lymph nodes, weight loss, chest pain, body aches, joint swelling, muscle aches, shortness of breath, mood changes, visual or auditory hallucinations.  Objective:    BP 120/75   Pulse 78   Ht 5\' 3"  (1.6 m)   Wt 134 lb 9.6  oz (61.1 kg)   SpO2 100%   BMI 23.84 kg/m   General: Well Developed, well nourished, and in no acute distress.  Neuro: Alert and oriented x3, extra-ocular muscles intact, sensation grossly intact. Cranial nerves II through XII are intact, motor, sensory, and coordinative functions are all intact. HEENT: Normocephalic, atraumatic, pupils equal round reactive to light, neck supple, no masses, no lymphadenopathy, thyroid nonpalpable. Oropharynx, nasopharynx, external ear canals are unremarkable. Skin: Warm and dry, no rashes noted.  Cardiac: Regular rate and rhythm, no murmurs rubs or gallops.  Respiratory: Clear to auscultation bilaterally. Not using accessory muscles, speaking in full sentences.   Abdominal: Soft, nontender, nondistended, positive bowel sounds, no masses, no organomegaly.  Musculoskeletal: Shoulder, elbow, wrist, hip, knee, ankle stable, and with full range of motion.  Impression and Recommendations:    Wellness examination Routine HCM labs reviewed. HCM reviewed/discussed. Anticipatory guidance regarding healthy weight, lifestyle and choices given. Recommend healthy diet.  Recommend approximately 150 minutes/week of moderate intensity exercise Recommend regular dental and vision exams Always use seatbelt/lap and shoulder restraints Recommend using smoke alarms and checking batteries at least twice a year Recommend using sunscreen when outside Patient is up-to-date with tetanus.  She also reports that she completed her mammogram through her OB/GYN recently  Right hand pain Patient has been having some ongoing right hand pain, likely at MCP of right index finger.  She is requesting referral to orthopedic surgery today for further evaluation, feel that this is reasonable, referral placed today  Return in about 1 year (around 05/04/2023) for CPE.   ___________________________________________ Jasia Hiltunen de [place], MD, ABFM, [facility] Primary Care and Sports Medicine [facility]

## 2022-05-05 ENCOUNTER — Ambulatory Visit (HOSPITAL_BASED_OUTPATIENT_CLINIC_OR_DEPARTMENT_OTHER): Payer: 59 | Admitting: Orthopaedic Surgery

## 2022-05-05 ENCOUNTER — Ambulatory Visit (INDEPENDENT_AMBULATORY_CARE_PROVIDER_SITE_OTHER): Payer: 59

## 2022-05-05 DIAGNOSIS — M79641 Pain in right hand: Secondary | ICD-10-CM

## 2022-05-05 DIAGNOSIS — S66811A Strain of other specified muscles, fascia and tendons at wrist and hand level, right hand, initial encounter: Secondary | ICD-10-CM

## 2022-05-05 NOTE — Progress Notes (Signed)
Chief Complaint: Right second metacarpal phalangeal pain     History of Present Illness:    [name] [name] is a 61 y.o. female presents with pain about the right second finger and pain over the MCP joint since February 2023 when she noticed this following a [employer] features [employer].  She does not feel any pop or specific injury during the match.  Since that time she has tenderness about the radial aspect of the second MCP.  She has been competing in multiple matches and enjoys doing doubles and tennis.  She is taking ibuprofen for the pain.  She does play pickle ball as well.    Surgical History:   None  PMH/PSH/Family History/Social History/Meds/Allergies:    Past Medical History:  Diagnosis Date   GERD (gastroesophageal reflux disease)    History of kidney stones    SVD (spontaneous vaginal delivery)    x 2   Past Surgical History:  Procedure Laterality Date   ANTERIOR AND POSTERIOR REPAIR WITH SACROSPINOUS FIXATION N/A 09/19/2014   Procedure: ANTERIOR AND POSTERIOR REPAIR WITH SACROSPINOUS FIXATION;  Surgeon: Meriel Pica, MD;  Location: [facility];  Service: Gynecology;  Laterality: N/A;   COLONOSCOPY     DILATION AND CURETTAGE OF UTERUS     LAPAROSCOPY N/A 09/19/2014   Procedure: FAILED LAPAROSCOPY DIAGNOSTIC;  Surgeon: Meriel Pica, MD;  Location: [facility];  Service: Gynecology;  Laterality: N/A;   lithotripsy     kidney stone   SPINE SURGERY     disc fragment removed   TUBAL LIGATION  1993   VAGINAL HYSTERECTOMY N/A 09/19/2014   Procedure: HYSTERECTOMY VAGINAL;  Surgeon: Meriel Pica, MD;  Location: [facility];  Service: Gynecology;  Laterality: N/A;   WISDOM TOOTH EXTRACTION     Social History   Socioeconomic History   Marital status: Married    Spouse name: Not on file   Number of children: 2   Years of education: Not on file   Highest education level: Not on file  Occupational History   Occupation: homemaker  Tobacco Use    Smoking status: Former    Packs/day: 1.00    Years: 30.00    Total pack years: 30.00    Types: Cigarettes    Quit date: 04/15/2014    Years since quitting: 8.0   Smokeless tobacco: Never  Substance and Sexual Activity   Alcohol use: Yes    Comment: socially   Drug use: No   Sexual activity: Yes    Birth control/protection: Surgical  Other Topics Concern   Not on file  Social History Narrative   Not on file   Social Determinants of Health   Financial Resource Strain: Not on file  Food Insecurity: Not on file  Transportation Needs: Not on file  Physical Activity: Not on file  Stress: Not on file  Social Connections: Not on file   Family History  Problem Relation Age of Onset   Hypertension Mother    Ovarian cancer Mother    High Cholesterol Mother    Healthy Sister    Drug abuse Son        heroine   Alcohol abuse Maternal Grandmother    Alcohol abuse Maternal Grandfather    Cancer Paternal Grandmother    Heart attack Paternal Grandfather    Alcohol abuse  Paternal Grandfather    Allergies  Allergen Reactions   Sulfa Antibiotics Rash   Current Outpatient Medications  Medication Sig Dispense Refill   Glucosamine HCl (GLUCOSAMINE PO) Take 4,000 mg by mouth daily.     Multiple Vitamin (MULTIVITAMIN) tablet Take 1 tablet by mouth daily.     omeprazole (PRILOSEC) 20 MG capsule Take 20 mg by mouth daily.     No current facility-administered medications for this visit.   No results found.  Review of Systems:   A ROS was performed including pertinent positives and negatives as documented in the HPI.  Physical Exam :   Constitutional: NAD and appears stated age Neurological: Alert and oriented Psych: Appropriate affect and cooperative There were no vitals taken for this visit.   Comprehensive Musculoskeletal Exam:    Tenderness to palpation about the right second MCP radially.  There is tenderness with valgus stress.  Otherwise full composite fist.  Sensation is  intact in all distributions 2+ radial pulse.  There is no triggering of the finger  Imaging:   Xray (3 views right hand): Normal    I personally reviewed and interpreted the radiographs.   Assessment:   61 y.o. female with a right MCP collateral ligament sprain.  I have advised that these typically do resolve completely.  I have specifically counseled her widening her grip which I do believe would help her significantly.  I will see her back as needed if this does not resolve completely within 6 to 8 weeks.  Plan :    -Return to clinic as needed     I personally saw and evaluated the patient, and participated in the management and treatment plan.  Huel Cote, MD Attending Physician, Orthopedic Surgery  This document was dictated using Dragon voice recognition software. A reasonable attempt at proof reading has been made to minimize errors.

## 2022-07-22 DIAGNOSIS — N200 Calculus of kidney: Secondary | ICD-10-CM | POA: Diagnosis not present

## 2022-07-22 DIAGNOSIS — R310 Gross hematuria: Secondary | ICD-10-CM | POA: Diagnosis not present

## 2023-05-05 ENCOUNTER — Ambulatory Visit (INDEPENDENT_AMBULATORY_CARE_PROVIDER_SITE_OTHER): Payer: 59 | Admitting: Family Medicine

## 2023-05-05 ENCOUNTER — Encounter (HOSPITAL_BASED_OUTPATIENT_CLINIC_OR_DEPARTMENT_OTHER): Payer: Self-pay | Admitting: Family Medicine

## 2023-05-05 VITALS — BP 114/73 | HR 59 | Ht 63.0 in | Wt 136.0 lb

## 2023-05-05 DIAGNOSIS — Z0000 Encounter for general adult medical examination without abnormal findings: Secondary | ICD-10-CM | POA: Diagnosis not present

## 2023-05-05 DIAGNOSIS — Z122 Encounter for screening for malignant neoplasm of respiratory organs: Secondary | ICD-10-CM | POA: Diagnosis not present

## 2023-05-05 NOTE — Progress Notes (Signed)
Subjective:    CC: Annual Physical Exam  HPI:  [name] [name] is a 62 y.o. presenting for annual physical  I reviewed the past medical history, family history, social history, surgical history, and allergies today and no changes were needed.  Please see the problem list section below in epic for further details.  Past Medical History: Past Medical History:  Diagnosis Date   GERD (gastroesophageal reflux disease)    History of kidney stones    SVD (spontaneous vaginal delivery)    x 2   Past Surgical History: Past Surgical History:  Procedure Laterality Date   ANTERIOR AND POSTERIOR REPAIR WITH SACROSPINOUS FIXATION N/A 09/19/2014   Procedure: ANTERIOR AND POSTERIOR REPAIR WITH SACROSPINOUS FIXATION;  Surgeon: Meriel Pica, MD;  Location: [facility];  Service: Gynecology;  Laterality: N/A;   COLONOSCOPY     DILATION AND CURETTAGE OF UTERUS     LAPAROSCOPY N/A 09/19/2014   Procedure: FAILED LAPAROSCOPY DIAGNOSTIC;  Surgeon: Meriel Pica, MD;  Location: [facility];  Service: Gynecology;  Laterality: N/A;   lithotripsy     kidney stone   SPINE SURGERY     disc fragment removed   TUBAL LIGATION  1993   VAGINAL HYSTERECTOMY N/A 09/19/2014   Procedure: HYSTERECTOMY VAGINAL;  Surgeon: Meriel Pica, MD;  Location: [facility];  Service: Gynecology;  Laterality: N/A;   WISDOM TOOTH EXTRACTION     Social History: Social History   Socioeconomic History   Marital status: Married    Spouse name: Not on file   Number of children: 2   Years of education: Not on file   Highest education level: Not on file  Occupational History   Occupation: homemaker  Tobacco Use   Smoking status: Former    Packs/day: 1.00    Years: 30.00    Additional pack years: 0.00    Total pack years: 30.00    Types: Cigarettes    Quit date: 04/15/2014    Years since quitting: 9.0   Smokeless tobacco: Never  Substance and Sexual Activity   Alcohol use: Yes    Comment: socially   Drug use: No   Sexual  activity: Yes    Birth control/protection: Surgical  Other Topics Concern   Not on file  Social History Narrative   Not on file   Social Determinants of Health   Financial Resource Strain: Not on file  Food Insecurity: Not on file  Transportation Needs: Not on file  Physical Activity: Not on file  Stress: Not on file  Social Connections: Not on file   Family History: Family History  Problem Relation Age of Onset   Hypertension Mother    Ovarian cancer Mother    High Cholesterol Mother    Healthy Sister    Drug abuse Son        heroine   Alcohol abuse Maternal Grandmother    Alcohol abuse Maternal Grandfather    Cancer Paternal Grandmother    Heart attack Paternal Grandfather    Alcohol abuse Paternal Grandfather    Allergies: Allergies  Allergen Reactions   Sulfa Antibiotics Rash   Medications: See med rec.  Review of Systems: No headache, visual changes, nausea, vomiting, diarrhea, constipation, dizziness, abdominal pain, skin rash, fevers, chills, night sweats, swollen lymph nodes, weight loss, chest pain, body aches, joint swelling, muscle aches, shortness of breath, mood changes, visual or auditory hallucinations.  Objective:    BP 114/73 (BP Location: Right Arm, Patient Position: Sitting, Cuff Size: Normal)  Pulse (!) 59   Ht 5\' 3"  (1.6 m)   Wt 136 lb (61.7 kg)   SpO2 98%   BMI 24.09 kg/m   General: Well Developed, well nourished, and in no acute distress. Neuro: Alert and oriented x3, extra-ocular muscles intact, sensation grossly intact. Cranial nerves II through XII are intact, motor, sensory, and coordinative functions are all intact. HEENT: Normocephalic, atraumatic, pupils equal round reactive to light, neck supple, no masses, no lymphadenopathy, thyroid nonpalpable. Oropharynx, nasopharynx, external ear canals are unremarkable. Skin: Warm and dry, no rashes noted. Cardiac: Regular rate and rhythm, no murmurs rubs or gallops. Respiratory: Clear to  auscultation bilaterally. Not using accessory muscles, speaking in full sentences. Abdominal: Soft, nontender, nondistended, positive bowel sounds, no masses, no organomegaly. Musculoskeletal: Shoulder, elbow, wrist, hip, knee, ankle stable, and with full range of motion.  Impression and Recommendations:    Wellness examination Assessment & Plan: Routine HCM labs ordered. HCM reviewed/discussed. Anticipatory guidance regarding healthy weight, lifestyle and choices given. Recommend healthy diet.  Recommend approximately 150 minutes/week of moderate intensity exercise Recommend regular dental and vision exams Always use seatbelt/lap and shoulder restraints Recommend using smoke alarms and checking batteries at least twice a year Recommend using sunscreen when outside Patient is up-to-date with tetanus.  She also reports that she completes her mammogram through her OB/GYN Due for lung cancer screening, discussed this, patient interested, order placed  Orders: -     CBC with Differential/Platelet -     Comprehensive metabolic panel -     Hemoglobin A1c -     Lipid panel -     TSH Rfx on Abnormal to Free T4  Screening for lung cancer -     CT CHEST LUNG CANCER SCREENING LOW DOSE WO CONTRAST; Future  Return in about 1 year (around 05/04/2024) for CPE.    ___________________________________________ Roann Merk de [place], MD, ABFM, [facility] Primary Care and Sports Medicine [facility]

## 2023-05-05 NOTE — Assessment & Plan Note (Addendum)
Routine HCM labs ordered. HCM reviewed/discussed. Anticipatory guidance regarding healthy weight, lifestyle and choices given. Recommend healthy diet.  Recommend approximately 150 minutes/week of moderate intensity exercise Recommend regular dental and vision exams Always use seatbelt/lap and shoulder restraints Recommend using smoke alarms and checking batteries at least twice a year Recommend using sunscreen when outside Patient is up-to-date with tetanus.  She also reports that she completes her mammogram through her OB/GYN Due for lung cancer screening, discussed this, patient interested, order placed

## 2023-05-05 NOTE — Patient Instructions (Signed)
  Medication Instructions:  Your physician recommends that you continue on your current medications as directed. Please refer to the Current Medication list given to you today. --If you need a refill on any your medications before your next appointment, please call your pharmacy first. If no refills are authorized on file call the office.-- Lab Work: Your physician has recommended that you have lab work today: Yes If you have labs (blood work) drawn today and your tests are completely normal, you will receive your results via MyChart message OR a phone call from our staff.  Please ensure you check your voicemail in the event that you authorized detailed messages to be left on a delegated number. If you have any lab test that is abnormal or we need to change your treatment, we will call you to review the results.  Referrals/Procedures/Imaging: No  Follow-Up: Your next appointment:   Your physician recommends that you schedule a follow-up appointment in 1 year with Dr. de [place].  You will receive a text message or e-mail with a link to a survey about your care and experience with us today! We would greatly appreciate your feedback!   Thanks for letting us be apart of your health journey!!  Primary Care and Sports Medicine   Dr. Raymond de [place]   We encourage you to activate your patient portal called "MyChart".  Sign up information is provided on this After Visit Summary.  MyChart is used to connect with patients for Virtual Visits (Telemedicine).  Patients are able to view lab/test results, encounter notes, upcoming appointments, etc.  Non-urgent messages can be sent to your provider as well. To learn more about what you can do with MyChart, please visit --  [email or web address].    

## 2023-05-06 LAB — COMPREHENSIVE METABOLIC PANEL
ALT: 22 IU/L (ref 0–32)
AST: 35 IU/L (ref 0–40)
Albumin: 4.1 g/dL (ref 3.9–4.9)
Alkaline Phosphatase: 93 IU/L (ref 44–121)
BUN/Creatinine Ratio: 21 (ref 12–28)
BUN: 16 mg/dL (ref 8–27)
Bilirubin Total: 0.5 mg/dL (ref 0.0–1.2)
CO2: 26 mmol/L (ref 20–29)
Calcium: 9.6 mg/dL (ref 8.7–10.3)
Chloride: 103 mmol/L (ref 96–106)
Creatinine, Ser: 0.78 mg/dL (ref 0.57–1.00)
Globulin, Total: 2.4 g/dL (ref 1.5–4.5)
Glucose: 79 mg/dL (ref 70–99)
Potassium: 4.9 mmol/L (ref 3.5–5.2)
Sodium: 142 mmol/L (ref 134–144)
Total Protein: 6.5 g/dL (ref 6.0–8.5)
eGFR: 86 mL/min/{1.73_m2} (ref >59)

## 2023-05-06 LAB — CBC WITH DIFFERENTIAL/PLATELET
Basophils Absolute: 0 10*3/uL (ref 0.0–0.2)
Basos: 0 %
EOS (ABSOLUTE): 0.2 10*3/uL (ref 0.0–0.4)
Eos: 3 %
Hematocrit: 43.7 % (ref 34.0–46.6)
Hemoglobin: 14.6 g/dL (ref 11.1–15.9)
Immature Grans (Abs): 0 10*3/uL (ref 0.0–0.1)
Immature Granulocytes: 0 %
Lymphocytes Absolute: 2.1 10*3/uL (ref 0.7–3.1)
Lymphs: 31 %
MCH: 31.9 pg (ref 26.6–33.0)
MCHC: 33.4 g/dL (ref 31.5–35.7)
MCV: 96 fL (ref 79–97)
Monocytes Absolute: 0.4 10*3/uL (ref 0.1–0.9)
Monocytes: 6 %
Neutrophils Absolute: 4 10*3/uL (ref 1.4–7.0)
Neutrophils: 60 %
Platelets: 249 10*3/uL (ref 150–450)
RBC: 4.57 x10E6/uL (ref 3.77–5.28)
RDW: 11.9 % (ref 11.7–15.4)
WBC: 6.9 10*3/uL (ref 3.4–10.8)

## 2023-05-06 LAB — TSH RFX ON ABNORMAL TO FREE T4: TSH: 1.4 u[IU]/mL (ref 0.450–4.500)

## 2023-05-06 LAB — LIPID PANEL
Chol/HDL Ratio: 2.3 ratio (ref 0.0–4.4)
Cholesterol, Total: 199 mg/dL (ref 100–199)
HDL: 87 mg/dL (ref >39)
LDL Chol Calc (NIH): 102 mg/dL — ABNORMAL HIGH (ref 0–99)
Triglycerides: 53 mg/dL (ref 0–149)
VLDL Cholesterol Cal: 10 mg/dL (ref 5–40)

## 2023-05-06 LAB — HEMOGLOBIN A1C
Est. average glucose Bld gHb Est-mCnc: 111 mg/dL
Hgb A1c MFr Bld: 5.5 % (ref 4.8–5.6)

## 2023-05-13 ENCOUNTER — Other Ambulatory Visit: Payer: 59

## 2023-05-31 ENCOUNTER — Encounter (HOSPITAL_BASED_OUTPATIENT_CLINIC_OR_DEPARTMENT_OTHER): Payer: Self-pay | Admitting: *Deleted

## 2023-07-11 DIAGNOSIS — Z6824 Body mass index (BMI) 24.0-24.9, adult: Secondary | ICD-10-CM | POA: Diagnosis not present

## 2023-07-11 DIAGNOSIS — Z1231 Encounter for screening mammogram for malignant neoplasm of breast: Secondary | ICD-10-CM | POA: Diagnosis not present

## 2023-07-11 DIAGNOSIS — Z01419 Encounter for gynecological examination (general) (routine) without abnormal findings: Secondary | ICD-10-CM | POA: Diagnosis not present

## 2023-07-11 LAB — HM MAMMOGRAPHY

## 2023-08-24 ENCOUNTER — Other Ambulatory Visit (HOSPITAL_BASED_OUTPATIENT_CLINIC_OR_DEPARTMENT_OTHER): Payer: Self-pay

## 2023-08-24 MED ORDER — AMOXICILLIN 500 MG PO CAPS
500.0000 mg | ORAL_CAPSULE | Freq: Three times a day (TID) | ORAL | 1 refills | Status: DC
  Filled 2023-08-24: qty 21, 7d supply, fill #0

## 2023-08-27 IMAGING — DX DG HAND COMPLETE 3+V*R*
3 series · 3 of 3 positions shown · non-contrast
Comparison: None Available.

CLINICAL DATA: Pain in right hand at second digit.

EXAM:
RIGHT HAND - COMPLETE 3+ VIEW

[hand ap]
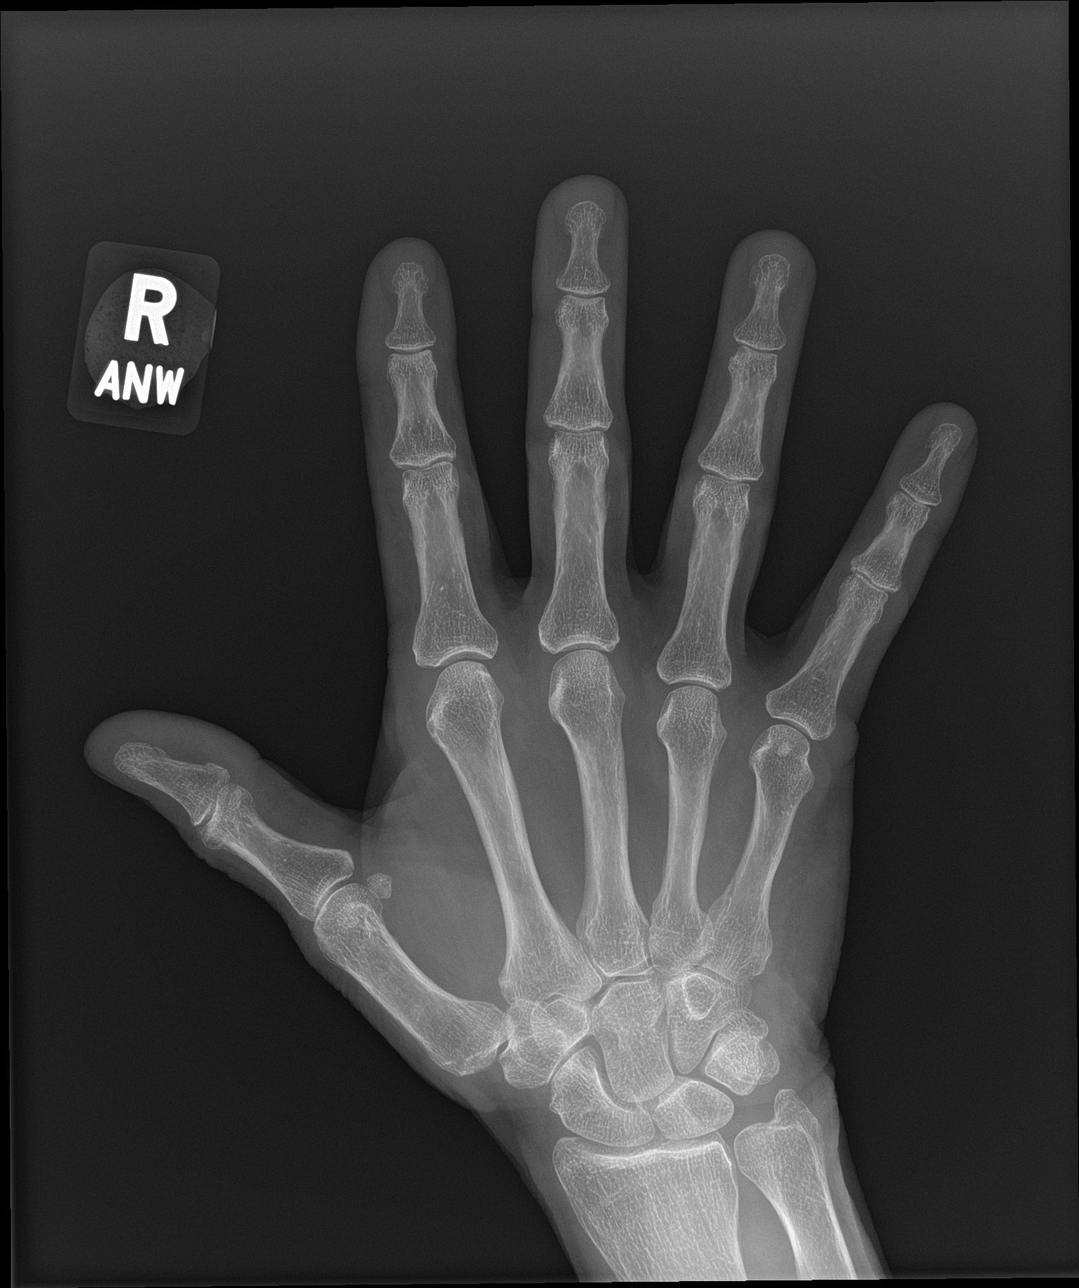

[hand obl]
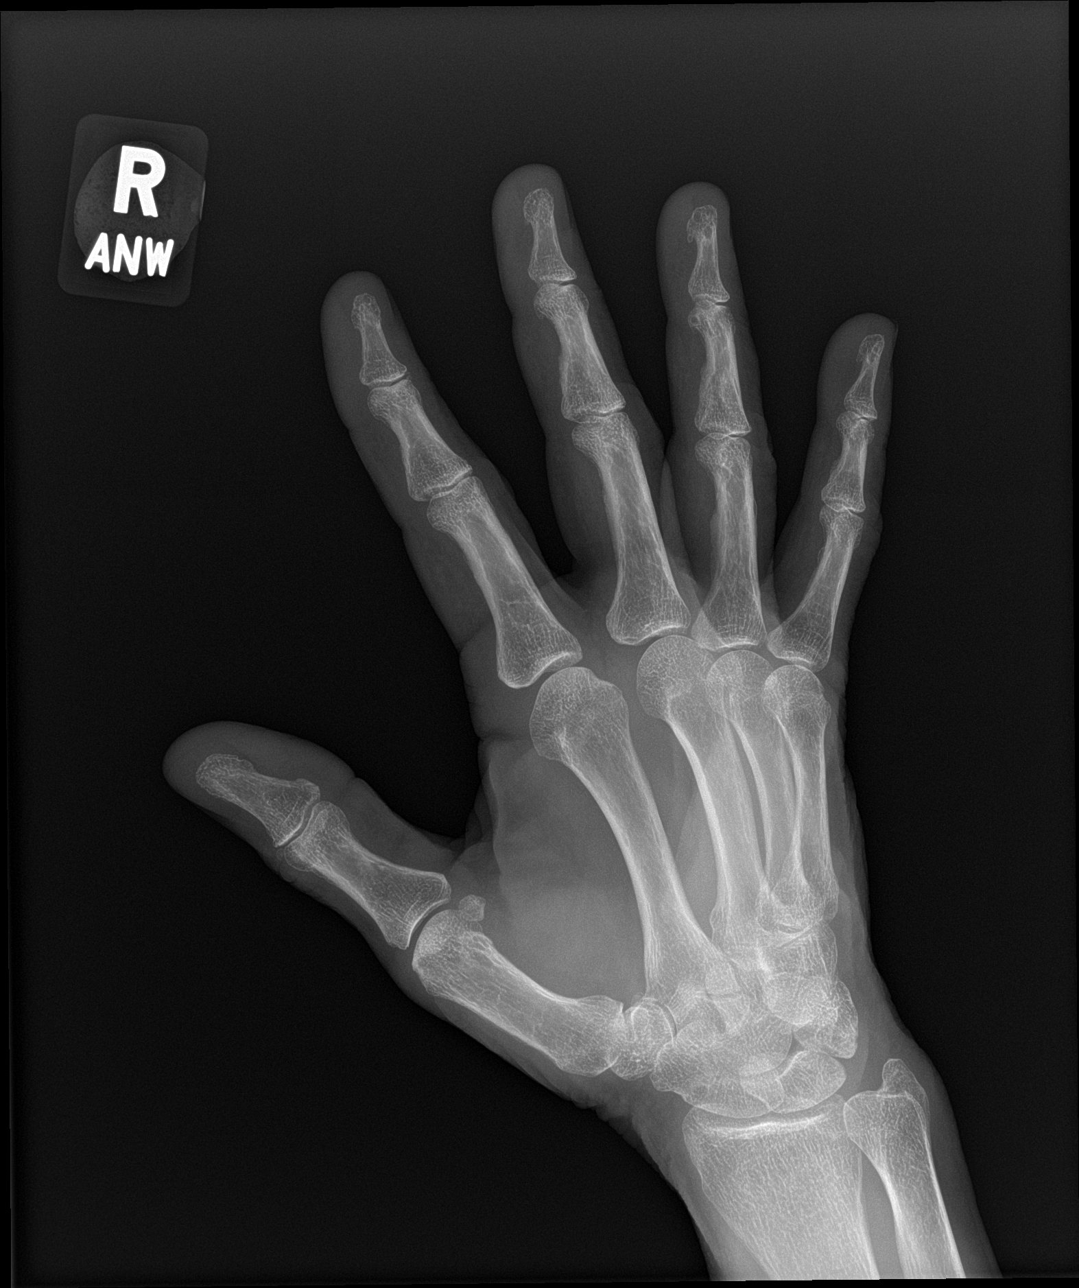

[hand lat]
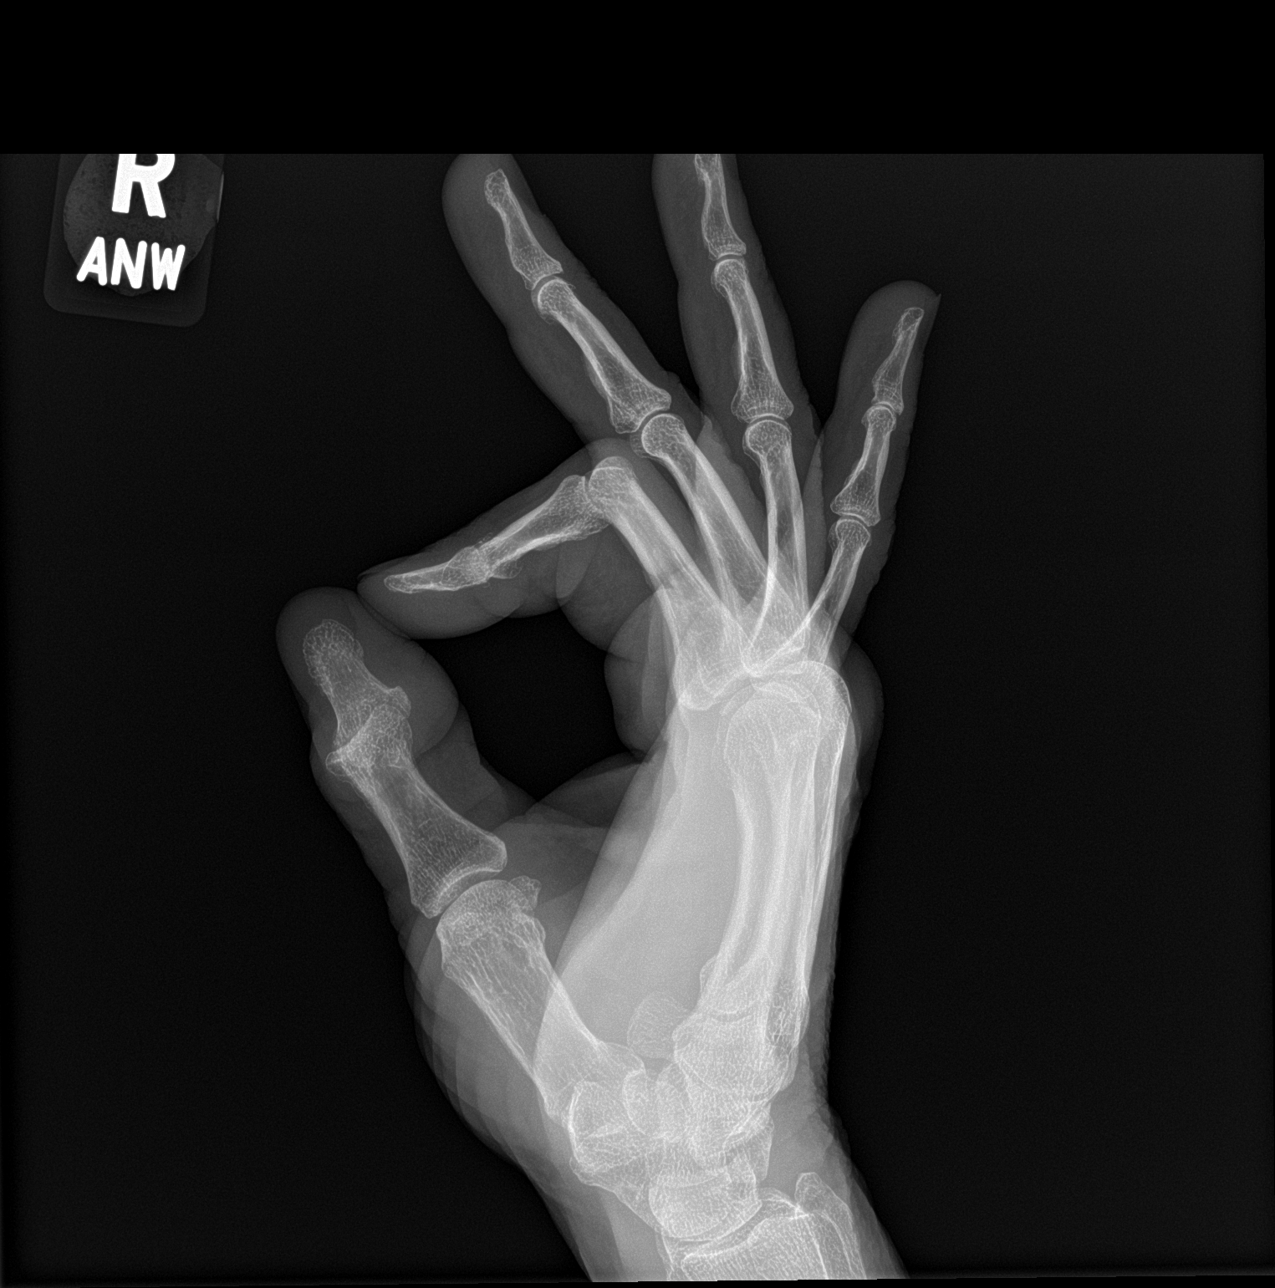

[3 of 3 positions shown; findings below may reference images not displayed]

FINDINGS: There is no evidence of fracture or dislocation. There is no
evidence of arthropathy or other focal bone abnormality. Soft
tissues are unremarkable.
IMPRESSION: Negative.

## 2023-12-07 ENCOUNTER — Encounter (HOSPITAL_BASED_OUTPATIENT_CLINIC_OR_DEPARTMENT_OTHER): Payer: Self-pay

## 2023-12-21 ENCOUNTER — Ambulatory Visit (HOSPITAL_BASED_OUTPATIENT_CLINIC_OR_DEPARTMENT_OTHER): Payer: 59 | Admitting: Family Medicine

## 2023-12-28 DIAGNOSIS — H40013 Open angle with borderline findings, low risk, bilateral: Secondary | ICD-10-CM | POA: Diagnosis not present

## 2023-12-28 DIAGNOSIS — H2513 Age-related nuclear cataract, bilateral: Secondary | ICD-10-CM | POA: Diagnosis not present

## 2024-05-07 ENCOUNTER — Ambulatory Visit (INDEPENDENT_AMBULATORY_CARE_PROVIDER_SITE_OTHER): Payer: 59 | Admitting: Family Medicine

## 2024-05-07 ENCOUNTER — Encounter (HOSPITAL_BASED_OUTPATIENT_CLINIC_OR_DEPARTMENT_OTHER): Payer: Self-pay | Admitting: Family Medicine

## 2024-05-07 ENCOUNTER — Telehealth (HOSPITAL_BASED_OUTPATIENT_CLINIC_OR_DEPARTMENT_OTHER): Payer: Self-pay | Admitting: *Deleted

## 2024-05-07 VITALS — BP 168/79 | HR 50 | Ht 63.0 in | Wt 139.6 lb

## 2024-05-07 DIAGNOSIS — Z0000 Encounter for general adult medical examination without abnormal findings: Secondary | ICD-10-CM | POA: Diagnosis not present

## 2024-05-07 DIAGNOSIS — N029 Recurrent and persistent hematuria with unspecified morphologic changes: Secondary | ICD-10-CM | POA: Insufficient documentation

## 2024-05-07 NOTE — Assessment & Plan Note (Signed)
Routine HCM labs ordered. HCM reviewed/discussed. Anticipatory guidance regarding healthy weight, lifestyle and choices given. Recommend healthy diet.  Recommend approximately 150 minutes/week of moderate intensity exercise Recommend regular dental and vision exams Always use seatbelt/lap and shoulder restraints Recommend using smoke alarms and checking batteries at least twice a year Recommend using sunscreen when outside Patient is up-to-date with tetanus.  She also reports that she completes her mammogram through her OB/GYN Due for lung cancer screening, discussed this, patient interested, order placed

## 2024-05-07 NOTE — Telephone Encounter (Signed)
 Copied from CRM [id number]. Topic: Appointments - Scheduling Inquiry for Clinic >> May 07, 2024  3:27 PM Edsel HERO wrote: Patient called to schedule a follow up blood pressure check. Please follow up with patient to schedule.

## 2024-05-07 NOTE — Patient Instructions (Signed)
   Medication Instructions:  Your physician recommends that you continue on your current medications as directed. Please refer to the Current Medication list given to you today. --If you need a refill on any your medications before your next appointment, please call your pharmacy first. If no refills are authorized on file call the office.-- Lab Work: Your physician has recommended that you have lab work today: today If you have labs (blood work) drawn today and your tests are completely normal, you will receive your results via MyChart message OR a phone call from our staff.  Please ensure you check your voicemail in the event that you authorized detailed messages to be left on a delegated number. If you have any lab test that is abnormal or we need to change your treatment, we will call you to review the results.    Follow-Up: Your next appointment:   Your physician recommends that you schedule a follow-up appointment in: 1 year physical with Dr. de [place]  You will receive a text message or e-mail with a link to a survey about your care and experience with [place] today! We would greatly appreciate your feedback!   Thanks for letting us be apart of your health journey!!  Primary Care and Sports Medicine   Dr. Ceasar Mons [place]   We encourage you to activate your patient portal called "MyChart".  Sign up information is provided on this After Visit Summary.  MyChart is used to connect with patients for Virtual Visits (Telemedicine).  Patients are able to view lab/test results, encounter notes, upcoming appointments, etc.  Non-urgent messages can be sent to your provider as well. To learn more about what you can do with MyChart, please visit --  [email or web address].

## 2024-05-07 NOTE — Progress Notes (Signed)
 Subjective:    CC: Annual Physical Exam  HPI:  [name] [name] is a 63 y.o. presenting for annual physical  I reviewed the past medical history, family history, social history, surgical history, and allergies today and no changes were needed.  Please see the problem list section below in epic for further details.  Past Medical History: Past Medical History:  Diagnosis Date   GERD (gastroesophageal reflux disease)    History of kidney stones    SVD (spontaneous vaginal delivery)    x 2   Past Surgical History: Past Surgical History:  Procedure Laterality Date   ANTERIOR AND POSTERIOR REPAIR WITH SACROSPINOUS FIXATION N/A 09/19/2014   Procedure: ANTERIOR AND POSTERIOR REPAIR WITH SACROSPINOUS FIXATION;  Surgeon: Charlie CHRISTELLA Croak, MD;  Location: [facility];  Service: Gynecology;  Laterality: N/A;   COLONOSCOPY     DILATION AND CURETTAGE OF UTERUS     LAPAROSCOPY N/A 09/19/2014   Procedure: FAILED LAPAROSCOPY DIAGNOSTIC;  Surgeon: Charlie CHRISTELLA Croak, MD;  Location: [facility];  Service: Gynecology;  Laterality: N/A;   lithotripsy     kidney stone   SPINE SURGERY     disc fragment removed   TUBAL LIGATION  1993   VAGINAL HYSTERECTOMY N/A 09/19/2014   Procedure: HYSTERECTOMY VAGINAL;  Surgeon: Charlie CHRISTELLA Croak, MD;  Location: [facility];  Service: Gynecology;  Laterality: N/A;   WISDOM TOOTH EXTRACTION     Social History: Social History   Socioeconomic History   Marital status: Married    Spouse name: Not on file   Number of children: 2   Years of education: Not on file   Highest education level: Not on file  Occupational History   Occupation: homemaker  Tobacco Use   Smoking status: Former    Current packs/day: 0.00    Average packs/day: 1 pack/day for 30.0 years (30.0 ttl pk-yrs)    Types: Cigarettes    Start date: 04/15/1984    Quit date: 04/15/2014    Years since quitting: 10.0    Passive exposure: Past   Smokeless tobacco: Never  Substance and Sexual Activity   Alcohol use:  Yes    Comment: socially   Drug use: No   Sexual activity: Yes    Birth control/protection: Surgical  Other Topics Concern   Not on file  Social History Narrative   Not on file   Social Drivers of Health   Financial Resource Strain: Low Risk  (05/07/2024)   Overall Financial Resource Strain (CARDIA)    Difficulty of Paying Living Expenses: Not hard at all  Food Insecurity: No Food Insecurity (05/07/2024)   Hunger Vital Sign    Worried About Running Out of Food in the Last Year: Never true    Ran Out of Food in the Last Year: Never true  Transportation Needs: No Transportation Needs (05/07/2024)   PRAPARE - Administrator, Civil Service (Medical): No    Lack of Transportation (Non-Medical): No  Physical Activity: Sufficiently Active (05/07/2024)   Exercise Vital Sign    Days of Exercise per Week: 3 days    Minutes of Exercise per Session: 60 min  Stress: No Stress Concern Present (05/07/2024)   [employer] of Occupational Health - Occupational Stress Questionnaire    Feeling of Stress: Not at all  Social Connections: Moderately Integrated (05/07/2024)   Social Connection and Isolation Panel    Frequency of Communication with Friends and Family: More than three times a week    Frequency of Social  Gatherings with Friends and Family: More than three times a week    Attends Religious Services: Never    Database administrator or Organizations: Yes    Attends Engineer, structural: More than 4 times per year    Marital Status: Married   Family History: Family History  Problem Relation Age of Onset   Hypertension Mother    Ovarian cancer Mother    High Cholesterol Mother    Healthy Sister    Drug abuse Son        heroine   Alcohol abuse Maternal Grandmother    Alcohol abuse Maternal Grandfather    Cancer Paternal Grandmother    Heart attack Paternal Grandfather    Alcohol abuse Paternal Grandfather    Allergies: Allergies  Allergen Reactions   Sulfa  Antibiotics Rash   Medications: See med rec.  Review of Systems: No headache, visual changes, nausea, vomiting, diarrhea, constipation, dizziness, abdominal pain, skin rash, fevers, chills, night sweats, swollen lymph nodes, weight loss, chest pain, body aches, joint swelling, muscle aches, shortness of breath, mood changes, visual or auditory hallucinations.  Objective:    BP (!) 168/79 (BP Location: Right Arm, Patient Position: Sitting, Cuff Size: Normal)   Pulse (!) 50   Ht 5' 3 (1.6 m)   Wt 139 lb 9.6 oz (63.3 kg)   SpO2 99%   BMI 24.73 kg/m   General: Well Developed, well nourished, and in no acute distress. Neuro: Alert and oriented x3, extra-ocular muscles intact, sensation grossly intact. Cranial nerves II through XII are intact, motor, sensory, and coordinative functions are all intact. HEENT: Normocephalic, atraumatic, pupils equal round reactive to light, neck supple, no masses, no lymphadenopathy, thyroid nonpalpable. Oropharynx, nasopharynx, external ear canals are unremarkable. Skin: Warm and dry, no rashes noted. Cardiac: Regular rate and rhythm, no murmurs rubs or gallops. Respiratory: Clear to auscultation bilaterally. Not using accessory muscles, speaking in full sentences. Abdominal: Soft, nontender, nondistended, positive bowel sounds, no masses, no organomegaly. Musculoskeletal: Shoulder, elbow, wrist, hip, knee, ankle stable, and with full range of motion.  Impression and Recommendations:    Wellness examination Assessment & Plan: Routine HCM labs ordered. HCM reviewed/discussed. Anticipatory guidance regarding healthy weight, lifestyle and choices given. Recommend healthy diet.  Recommend approximately 150 minutes/week of moderate intensity exercise Recommend regular dental and vision exams Always use seatbelt/lap and shoulder restraints Recommend using smoke alarms and checking batteries at least twice a year Recommend using sunscreen when outside Patient  is up-to-date with tetanus.  She also reports that she completes her mammogram through her OB/GYN Due for lung cancer screening, discussed this, patient interested, order placed  Orders: -     CBC with Differential/Platelet -     Comprehensive metabolic panel with GFR -     Lipid panel -     TSH Rfx on Abnormal to Free T4  Return in about 1 year (around 05/07/2025) for CPE with fasting labs 1 week prior.   ___________________________________________ Reyana Leisey de [place], MD, ABFM, CAQSM Primary Care and Sports Medicine [facility]

## 2024-05-07 NOTE — Telephone Encounter (Signed)
 Please schedule nurse visit

## 2024-05-07 NOTE — Telephone Encounter (Signed)
Scheduled her.

## 2024-05-08 LAB — COMPREHENSIVE METABOLIC PANEL WITH GFR
ALT: 17 IU/L (ref 0–32)
AST: 26 IU/L (ref 0–40)
Albumin: 4.3 g/dL (ref 3.9–4.9)
Alkaline Phosphatase: 91 IU/L (ref 44–121)
BUN/Creatinine Ratio: 19 (ref 12–28)
BUN: 13 mg/dL (ref 8–27)
Bilirubin Total: 0.6 mg/dL (ref 0.0–1.2)
CO2: 26 mmol/L (ref 20–29)
Calcium: 9.8 mg/dL (ref 8.7–10.3)
Chloride: 102 mmol/L (ref 96–106)
Creatinine, Ser: 0.68 mg/dL (ref 0.57–1.00)
Globulin, Total: 2.5 g/dL (ref 1.5–4.5)
Glucose: 77 mg/dL (ref 70–99)
Potassium: 4.2 mmol/L (ref 3.5–5.2)
Sodium: 141 mmol/L (ref 134–144)
Total Protein: 6.8 g/dL (ref 6.0–8.5)
eGFR: 98 mL/min/{1.73_m2} (ref >59)

## 2024-05-08 LAB — CBC WITH DIFFERENTIAL/PLATELET
Basophils Absolute: 0 10*3/uL (ref 0.0–0.2)
Basos: 0 %
EOS (ABSOLUTE): 0.2 10*3/uL (ref 0.0–0.4)
Eos: 2 %
Hematocrit: 45.6 % (ref 34.0–46.6)
Hemoglobin: 14.4 g/dL (ref 11.1–15.9)
Immature Grans (Abs): 0 10*3/uL (ref 0.0–0.1)
Immature Granulocytes: 0 %
Lymphocytes Absolute: 2.3 10*3/uL (ref 0.7–3.1)
Lymphs: 33 %
MCH: 31.3 pg (ref 26.6–33.0)
MCHC: 31.6 g/dL (ref 31.5–35.7)
MCV: 99 fL — ABNORMAL HIGH (ref 79–97)
Monocytes Absolute: 0.5 10*3/uL (ref 0.1–0.9)
Monocytes: 7 %
Neutrophils Absolute: 4 10*3/uL (ref 1.4–7.0)
Neutrophils: 58 %
Platelets: 232 10*3/uL (ref 150–450)
RBC: 4.6 x10E6/uL (ref 3.77–5.28)
RDW: 12.2 % (ref 11.7–15.4)
WBC: 7.1 10*3/uL (ref 3.4–10.8)

## 2024-05-08 LAB — LIPID PANEL
Chol/HDL Ratio: 2.3 ratio (ref 0.0–4.4)
Cholesterol, Total: 207 mg/dL — ABNORMAL HIGH (ref 100–199)
HDL: 90 mg/dL (ref >39)
LDL Chol Calc (NIH): 103 mg/dL — ABNORMAL HIGH (ref 0–99)
Triglycerides: 77 mg/dL (ref 0–149)
VLDL Cholesterol Cal: 14 mg/dL (ref 5–40)

## 2024-05-08 LAB — TSH RFX ON ABNORMAL TO FREE T4: TSH: 1.58 u[IU]/mL (ref 0.450–4.500)

## 2024-05-09 ENCOUNTER — Encounter (HOSPITAL_BASED_OUTPATIENT_CLINIC_OR_DEPARTMENT_OTHER): Payer: Self-pay | Admitting: *Deleted

## 2024-05-15 ENCOUNTER — Ambulatory Visit (HOSPITAL_BASED_OUTPATIENT_CLINIC_OR_DEPARTMENT_OTHER): Admitting: Family Medicine

## 2024-05-15 ENCOUNTER — Encounter (HOSPITAL_BASED_OUTPATIENT_CLINIC_OR_DEPARTMENT_OTHER): Payer: Self-pay | Admitting: Family Medicine

## 2024-05-15 VITALS — BP 159/75 | HR 62 | Ht 63.0 in | Wt 143.3 lb

## 2024-05-15 DIAGNOSIS — S76319A Strain of muscle, fascia and tendon of the posterior muscle group at thigh level, unspecified thigh, initial encounter: Secondary | ICD-10-CM | POA: Insufficient documentation

## 2024-05-15 DIAGNOSIS — S76311A Strain of muscle, fascia and tendon of the posterior muscle group at thigh level, right thigh, initial encounter: Secondary | ICD-10-CM | POA: Diagnosis not present

## 2024-05-15 NOTE — Assessment & Plan Note (Signed)
 About 1 week ago, patient was running down the shot while playing tennis and injured her hamstring.  She denies any prior injuries to hamstring before this.  Pain is primarily near middle of posterior thigh.  She has noted some bruising recently.  Has been able to walk, however has had limited range of motion of right leg as a result.  Has been using conservative measures including NSAID.  Wonders about PT referral. On exam, patient is in no acute distress.  Gait is antalgic, limited range of motion of right leg in regards to hip and knee.  On inspection, she does have some bruising noted towards medial aspect of thigh, proximal to knee..  She also has some bruising more proximally through medial thigh.  Mild tenderness palpation through middle of right hamstring.  No tenderness to palpation distally or proximally through tendons.  Slightly reduced strength for resisted knee extension. Suspect primary soft tissue injury of hamstring, do not suspect tendon involvement, do not suspect avulsion at this time.  Recommend proceeding with conservative measures including NSAID, topical treatments such as heat or ice.  I also feel that patient would benefit from physical therapy, referral placed today.  Advised that if long wait time with referral, we can always switch referral to alternative location as well.  Suspect about 4 to 6-week recovery timeframe.  If not responding as expected, recommend returning to office for further evaluation

## 2024-05-15 NOTE — Patient Instructions (Signed)
  Medication Instructions:  Your physician recommends that you continue on your current medications as directed. Please refer to the Current Medication list given to you today. --If you need a refill on any your medications before your next appointment, please call your pharmacy first. If no refills are authorized on file call the office.--   Referrals/Procedures/Imaging: Physical Therapy  Follow-Up: Your next appointment:   Your physician recommends that you schedule a follow-up appointment in: as needed with Dr. de [place]  You will receive a text message or e-mail with a link to a survey about your care and experience with us  today! We would greatly appreciate your feedback!   Thanks for letting us  be apart of your health journey!!  Primary Care and Sports Medicine   Dr. Quintin sheerer [place]   We encourage you to activate your patient portal called MyChart.  Sign up information is provided on this After Visit Summary.  MyChart is used to connect with patients for Virtual Visits (Telemedicine).  Patients are able to view lab/test results, encounter notes, upcoming appointments, etc.  Non-urgent messages can be sent to your provider as well. To learn more about what you can do with MyChart, please visit --  [email or web address].

## 2024-05-15 NOTE — Progress Notes (Signed)
    Procedures performed today:    None.  Independent interpretation of notes and tests performed by another provider:   None.  Brief History, Exam, Impression, and Recommendations:    BP (!) 159/75 (BP Location: Left Arm, Patient Position: Sitting, Cuff Size: Normal)   Pulse 62   Ht 5' 3 (1.6 m)   Wt 143 lb 4.8 oz (65 kg)   SpO2 100%   BMI 25.38 kg/m   Strain of right hamstring, initial encounter Assessment & Plan: About 1 week ago, patient was running down the shot while playing tennis and injured her hamstring.  She denies any prior injuries to hamstring before this.  Pain is primarily near middle of posterior thigh.  She has noted some bruising recently.  Has been able to walk, however has had limited range of motion of right leg as a result.  Has been using conservative measures including NSAID.  Wonders about PT referral. On exam, patient is in no acute distress.  Gait is antalgic, limited range of motion of right leg in regards to hip and knee.  On inspection, she does have some bruising noted towards medial aspect of thigh, proximal to knee..  She also has some bruising more proximally through medial thigh.  Mild tenderness palpation through middle of right hamstring.  No tenderness to palpation distally or proximally through tendons.  Slightly reduced strength for resisted knee extension. Suspect primary soft tissue injury of hamstring, do not suspect tendon involvement, do not suspect avulsion at this time.  Recommend proceeding with conservative measures including NSAID, topical treatments such as heat or ice.  I also feel that patient would benefit from physical therapy, referral placed today.  Advised that if long wait time with referral, we can always switch referral to alternative location as well.  Suspect about 4 to 6-week recovery timeframe.  If not responding as expected, recommend returning to office for further evaluation  Orders: -     Ambulatory referral to Physical  Therapy  Return if symptoms worsen or fail to improve.   ___________________________________________ Tarence Searcy de [place], MD, ABFM, [facility] Primary Care and Sports Medicine [facility]

## 2024-05-16 ENCOUNTER — Telehealth (HOSPITAL_BASED_OUTPATIENT_CLINIC_OR_DEPARTMENT_OTHER): Payer: Self-pay | Admitting: *Deleted

## 2024-05-16 ENCOUNTER — Other Ambulatory Visit (HOSPITAL_BASED_OUTPATIENT_CLINIC_OR_DEPARTMENT_OTHER): Payer: Self-pay | Admitting: *Deleted

## 2024-05-16 DIAGNOSIS — S76311A Strain of muscle, fascia and tendon of the posterior muscle group at thigh level, right thigh, initial encounter: Secondary | ICD-10-CM

## 2024-05-16 NOTE — Telephone Encounter (Signed)
 New Referral placed for patient

## 2024-05-16 NOTE — Telephone Encounter (Signed)
 Copied from CRM [id number]. Topic: Referral - Request for Referral >> May 16, 2024 11:49 AM Everette C wrote: Did the patient discuss referral with their provider in the last year? Yes (If No - schedule appointment) (If Yes - send message)  Appointment offered? No  Type of order/referral and detailed reason for visit: Physical Therapy, [facility] on 05/22/24, right hamstring concern   Preference of office, provider, location: [facility]   If referral order, have you been seen by this specialty before? Yes (If Yes, this issue or another issue? When? Where?  Can we respond through MyChart? No

## 2024-05-16 NOTE — Telephone Encounter (Signed)
 Copied from CRM [id number]. Topic: Referral - Question >> May 16, 2024 10:38 AM Ivette P wrote: Reason for CRM: Pt called in to see about gettign a referral to another PT, the current referral is pushed out until end of aug.    Pls follow up with pt [phone]

## 2024-05-16 NOTE — Telephone Encounter (Signed)
 Spoke with patient let her know we resent referral and she could also call around and see if PT near her could see her sooner and let us  know.

## 2024-05-22 DIAGNOSIS — M79604 Pain in right leg: Secondary | ICD-10-CM | POA: Diagnosis not present

## 2024-05-25 DIAGNOSIS — M79604 Pain in right leg: Secondary | ICD-10-CM | POA: Diagnosis not present

## 2024-05-29 ENCOUNTER — Ambulatory Visit (HOSPITAL_BASED_OUTPATIENT_CLINIC_OR_DEPARTMENT_OTHER): Payer: Self-pay | Admitting: Family Medicine

## 2024-05-29 DIAGNOSIS — M79604 Pain in right leg: Secondary | ICD-10-CM | POA: Diagnosis not present

## 2024-06-04 DIAGNOSIS — M79604 Pain in right leg: Secondary | ICD-10-CM | POA: Diagnosis not present

## 2024-06-08 DIAGNOSIS — M79604 Pain in right leg: Secondary | ICD-10-CM | POA: Diagnosis not present

## 2024-06-27 DIAGNOSIS — M79604 Pain in right leg: Secondary | ICD-10-CM | POA: Diagnosis not present

## 2024-07-05 DIAGNOSIS — M79604 Pain in right leg: Secondary | ICD-10-CM | POA: Diagnosis not present

## 2024-07-11 DIAGNOSIS — Z1231 Encounter for screening mammogram for malignant neoplasm of breast: Secondary | ICD-10-CM | POA: Diagnosis not present

## 2024-07-11 DIAGNOSIS — Z6825 Body mass index (BMI) 25.0-25.9, adult: Secondary | ICD-10-CM | POA: Diagnosis not present

## 2024-07-11 DIAGNOSIS — M79604 Pain in right leg: Secondary | ICD-10-CM | POA: Diagnosis not present

## 2024-07-11 DIAGNOSIS — Z13820 Encounter for screening for osteoporosis: Secondary | ICD-10-CM | POA: Diagnosis not present

## 2024-07-11 DIAGNOSIS — Z01419 Encounter for gynecological examination (general) (routine) without abnormal findings: Secondary | ICD-10-CM | POA: Diagnosis not present

## 2024-08-08 ENCOUNTER — Ambulatory Visit (INDEPENDENT_AMBULATORY_CARE_PROVIDER_SITE_OTHER): Admitting: *Deleted

## 2024-08-08 VITALS — BP 136/84

## 2024-08-08 DIAGNOSIS — R030 Elevated blood-pressure reading, without diagnosis of hypertension: Secondary | ICD-10-CM | POA: Diagnosis not present

## 2024-08-08 NOTE — Progress Notes (Signed)
 Patient came in today for a BP recheck. BP checked with dynamap which was elevated. Pt stated that every time her BP is checked with dynamap, it is always elevated but when she goes to GYN, they always check it manually which it always comes back within normal range.  Checked it about 5 minutes later via dynamap which it was still elevated. BP checked manually which was within normal limits.   All BPs have been documented. Told pt that I would make a comment to always check her BP manually as each time it is checked with dynamap it is always elevated.

## 2024-09-27 DIAGNOSIS — Z1211 Encounter for screening for malignant neoplasm of colon: Secondary | ICD-10-CM | POA: Diagnosis not present

## 2024-09-27 DIAGNOSIS — K5730 Diverticulosis of large intestine without perforation or abscess without bleeding: Secondary | ICD-10-CM | POA: Diagnosis not present

## 2024-11-02 LAB — HM COLONOSCOPY

## 2024-11-05 ENCOUNTER — Ambulatory Visit (HOSPITAL_BASED_OUTPATIENT_CLINIC_OR_DEPARTMENT_OTHER): Payer: Self-pay | Admitting: Family Medicine

## 2025-04-29 ENCOUNTER — Other Ambulatory Visit (HOSPITAL_BASED_OUTPATIENT_CLINIC_OR_DEPARTMENT_OTHER)

## 2025-05-08 ENCOUNTER — Encounter (HOSPITAL_BASED_OUTPATIENT_CLINIC_OR_DEPARTMENT_OTHER): Admitting: Family Medicine
